# Patient Record
Sex: Female | Born: 1955
Health system: Southern US, Community
[De-identification: ages and names within clinical notes are randomized; demographics above are authoritative.]

## PROBLEM LIST (undated history)

## (undated) DIAGNOSIS — D219 Benign neoplasm of connective and other soft tissue, unspecified: Secondary | ICD-10-CM

## (undated) DIAGNOSIS — K900 Celiac disease: Secondary | ICD-10-CM

## (undated) DIAGNOSIS — A64 Unspecified sexually transmitted disease: Secondary | ICD-10-CM

## (undated) DIAGNOSIS — K9041 Non-celiac gluten sensitivity: Secondary | ICD-10-CM

## (undated) DIAGNOSIS — N946 Dysmenorrhea, unspecified: Secondary | ICD-10-CM

## (undated) DIAGNOSIS — D490 Neoplasm of unspecified behavior of digestive system: Secondary | ICD-10-CM

## (undated) DIAGNOSIS — F32A Depression, unspecified: Secondary | ICD-10-CM

## (undated) DIAGNOSIS — M810 Age-related osteoporosis without current pathological fracture: Secondary | ICD-10-CM

## (undated) DIAGNOSIS — N3090 Cystitis, unspecified without hematuria: Secondary | ICD-10-CM

## (undated) DIAGNOSIS — Z9189 Other specified personal risk factors, not elsewhere classified: Secondary | ICD-10-CM

## (undated) DIAGNOSIS — F329 Major depressive disorder, single episode, unspecified: Secondary | ICD-10-CM

## (undated) DIAGNOSIS — IMO0002 Reserved for concepts with insufficient information to code with codable children: Secondary | ICD-10-CM

## (undated) DIAGNOSIS — N920 Excessive and frequent menstruation with regular cycle: Secondary | ICD-10-CM

## (undated) HISTORY — DX: Benign neoplasm of connective and other soft tissue, unspecified: D21.9

## (undated) HISTORY — DX: Depression, unspecified: F32.A

## (undated) HISTORY — DX: Celiac disease: K90.0

## (undated) HISTORY — DX: Excessive and frequent menstruation with regular cycle: N92.0

## (undated) HISTORY — DX: Reserved for concepts with insufficient information to code with codable children: IMO0002

## (undated) HISTORY — DX: Major depressive disorder, single episode, unspecified: F32.9

## (undated) HISTORY — DX: Neoplasm of unspecified behavior of digestive system: D49.0

## (undated) HISTORY — DX: Age-related osteoporosis without current pathological fracture: M81.0

## (undated) HISTORY — DX: Unspecified sexually transmitted disease: A64

## (undated) HISTORY — DX: Other specified personal risk factors, not elsewhere classified: Z91.89

## (undated) HISTORY — DX: Dysmenorrhea, unspecified: N94.6

## (undated) HISTORY — DX: Cystitis, unspecified without hematuria: N30.90

## (undated) HISTORY — DX: Non-celiac gluten sensitivity: K90.41

---
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
## 1984-07-21 DIAGNOSIS — A64 Unspecified sexually transmitted disease: Secondary | ICD-10-CM

## 1984-07-21 HISTORY — DX: Unspecified sexually transmitted disease: A64

## 1991-06-21 DIAGNOSIS — D490 Neoplasm of unspecified behavior of digestive system: Secondary | ICD-10-CM

## 1991-06-21 HISTORY — DX: Neoplasm of unspecified behavior of digestive system: D49.0

## 1991-06-21 HISTORY — PX: SALIVARY GLAND SURGERY: SHX768

## 1994-11-19 DIAGNOSIS — N3090 Cystitis, unspecified without hematuria: Secondary | ICD-10-CM

## 1994-11-19 HISTORY — DX: Cystitis, unspecified without hematuria: N30.90

## 1996-01-14 DIAGNOSIS — R87619 Unspecified abnormal cytological findings in specimens from cervix uteri: Secondary | ICD-10-CM

## 1996-01-14 DIAGNOSIS — IMO0002 Reserved for concepts with insufficient information to code with codable children: Secondary | ICD-10-CM

## 1996-01-14 HISTORY — DX: Unspecified abnormal cytological findings in specimens from cervix uteri: R87.619

## 1996-01-14 HISTORY — DX: Reserved for concepts with insufficient information to code with codable children: IMO0002

## 1998-02-26 ENCOUNTER — Other Ambulatory Visit: Admission: RE | Admit: 1998-02-26 | Discharge: 1998-02-26 | Payer: Self-pay | Admitting: Obstetrics and Gynecology

## 1998-09-20 ENCOUNTER — Other Ambulatory Visit: Admission: RE | Admit: 1998-09-20 | Discharge: 1998-09-20 | Payer: Self-pay | Admitting: Obstetrics and Gynecology

## 1999-03-01 ENCOUNTER — Other Ambulatory Visit: Admission: RE | Admit: 1999-03-01 | Discharge: 1999-03-01 | Payer: Self-pay | Admitting: Obstetrics and Gynecology

## 1999-09-18 ENCOUNTER — Other Ambulatory Visit: Admission: RE | Admit: 1999-09-18 | Discharge: 1999-09-18 | Payer: Self-pay | Admitting: Obstetrics and Gynecology

## 2000-03-31 ENCOUNTER — Other Ambulatory Visit: Admission: RE | Admit: 2000-03-31 | Discharge: 2000-03-31 | Payer: Self-pay | Admitting: Obstetrics and Gynecology

## 2000-09-29 ENCOUNTER — Other Ambulatory Visit: Admission: RE | Admit: 2000-09-29 | Discharge: 2000-09-29 | Payer: Self-pay | Admitting: Obstetrics and Gynecology

## 2001-04-13 ENCOUNTER — Other Ambulatory Visit: Admission: RE | Admit: 2001-04-13 | Discharge: 2001-04-13 | Payer: Self-pay | Admitting: Obstetrics and Gynecology

## 2001-10-05 ENCOUNTER — Other Ambulatory Visit: Admission: RE | Admit: 2001-10-05 | Discharge: 2001-10-05 | Payer: Self-pay | Admitting: *Deleted

## 2002-04-14 ENCOUNTER — Other Ambulatory Visit: Admission: RE | Admit: 2002-04-14 | Discharge: 2002-04-14 | Payer: Self-pay | Admitting: Obstetrics and Gynecology

## 2002-07-21 HISTORY — PX: DILATION AND CURETTAGE OF UTERUS: SHX78

## 2002-08-05 ENCOUNTER — Ambulatory Visit (HOSPITAL_COMMUNITY): Admission: RE | Admit: 2002-08-05 | Discharge: 2002-08-05 | Payer: Self-pay | Admitting: Obstetrics and Gynecology

## 2002-09-19 HISTORY — PX: TOTAL ABDOMINAL HYSTERECTOMY: SHX209

## 2002-09-27 ENCOUNTER — Inpatient Hospital Stay (HOSPITAL_COMMUNITY): Admission: RE | Admit: 2002-09-27 | Discharge: 2002-09-29 | Payer: Self-pay | Admitting: Obstetrics and Gynecology

## 2005-07-22 ENCOUNTER — Encounter: Admission: RE | Admit: 2005-07-22 | Discharge: 2005-10-20 | Payer: Self-pay | Admitting: Gastroenterology

## 2006-10-16 ENCOUNTER — Other Ambulatory Visit: Admission: RE | Admit: 2006-10-16 | Discharge: 2006-10-16 | Payer: Self-pay | Admitting: Obstetrics & Gynecology

## 2013-04-14 ENCOUNTER — Encounter: Payer: Self-pay | Admitting: Obstetrics & Gynecology

## 2013-05-03 ENCOUNTER — Encounter: Payer: Self-pay | Admitting: Obstetrics & Gynecology

## 2013-05-03 ENCOUNTER — Ambulatory Visit (INDEPENDENT_AMBULATORY_CARE_PROVIDER_SITE_OTHER): Payer: 59 | Admitting: Obstetrics & Gynecology

## 2013-05-03 VITALS — BP 118/62 | HR 60 | Resp 16 | Ht 67.25 in | Wt 137.4 lb

## 2013-05-03 DIAGNOSIS — M810 Age-related osteoporosis without current pathological fracture: Secondary | ICD-10-CM

## 2013-05-03 NOTE — Progress Notes (Signed)
Subjective:    The patient is a 27 yrs MarriedCaucasian G0P0  female with LMP 07/21/2002 here to discuss most recent BMD done 03/09/13.  In reviewing results with pt, I realized that the right hip, the one with the ostoeporosis, has never been imaged in the past.  This is the one showing osteoporosis.  Overall, the BMD does look stable when comparisons from the left and spine are made.  We discussed this as well as options for treatment as below.  She does not want to do anything until she know si this is really a new finding or whether the finding is stable.    Osteoporosis Risk Factors  Nonmodifiable Personal Hx of fracture as an adult: no Family hx of osteoporosis:  Yes, mother Hx of fracture in first-degree relative: yes - mother, neck Caucasian race: yes Advanced age: no Female sex: yes Dementia: no Poor health/frailty: no  Potentially modifiable: Tobacco use: no Low body weight (<127 lbs): no Estrogen deficiency  early menopause (age <45) or bilateral ovariectomy: no  prolonged premenopausal amenorrhea (>1 yr): no Low calcium intake (lifelong): no Alcohol use more than 2 drinks per day: no Recurrent falls: no Inadequate physical activity: no  Current calcium and Vit D intake:  Calcium 500mg /day, MVI with Vit D  Review of Systems A comprehensive review of systems was negative.     Objective:   PHYSICAL EXAM BP 118/62  Pulse 60  Resp 16  Ht 5' 7.25" (1.708 m)  Wt 137 lb 6.4 oz (62.324 kg)  BMI 21.36 kg/m2  LMP 07/21/2002  General appearance: alert and cooperative  Imaging Bone Density: Spine T Score: -2.0, Hip T Score: -2.7   Done on 03/09/13 FRAX score:  10 year probability of hip fracture: 2.6%.                        10-year probability of major osteoporotic fractures combined is 10.6%.   Assessment:   Osteoporosis   Plan:     1.  Will contact [facility].  If have never imaged right side, will repeat BMD 2 years.  If have and there is change, consider  treatment options.    2.  Pharmacologic therapy therapy below discussed including risks and benefits.   Bisphosphonates po (Fosamax, Actonel, Boniva)  Bisphosphonate IV (Reclast)  Evista  Prolia subcutaneous   Forteo subcutaneous  Calcitonin nasal spray  Estrogen/progesterone therapy

## 2013-05-03 NOTE — Patient Instructions (Signed)
I will call and let you know about the BMD results from the past--if right hip was imaged in the past.

## 2013-08-17 ENCOUNTER — Ambulatory Visit: Payer: Self-pay | Admitting: Obstetrics & Gynecology

## 2013-08-19 ENCOUNTER — Ambulatory Visit: Payer: Self-pay | Admitting: Obstetrics & Gynecology

## 2013-08-22 ENCOUNTER — Encounter: Payer: Self-pay | Admitting: Obstetrics & Gynecology

## 2013-08-22 ENCOUNTER — Ambulatory Visit (INDEPENDENT_AMBULATORY_CARE_PROVIDER_SITE_OTHER): Payer: 59 | Admitting: Obstetrics & Gynecology

## 2013-08-22 VITALS — BP 120/62 | HR 64 | Resp 16 | Ht 67.5 in | Wt 137.6 lb

## 2013-08-22 DIAGNOSIS — Z01419 Encounter for gynecological examination (general) (routine) without abnormal findings: Secondary | ICD-10-CM

## 2013-08-22 DIAGNOSIS — Z124 Encounter for screening for malignant neoplasm of cervix: Secondary | ICD-10-CM

## 2013-08-22 NOTE — Patient Instructions (Signed)

## 2013-08-22 NOTE — Progress Notes (Addendum)
58 y.o. G0P0 [name] here for annual exam.  No vaginal bleeding.  Reports she has lots of animals/cats.  Has labs with Dr. Drema Dallas.  BMD 10/14 with osteoporosis.  Pt will have repeat BMD 2 years.  Will ask specifically for both hips to be imaged as right femur is one with osteoporosis (t score -2.7) and left has moderate osteopenia (t score -1.9).  Right has no prior comparisons.  Left was decreased 7% from 2011 to 2014.   Patient's last menstrual period was 07/21/2002.          Sexually active: no  The current method of family planning is status post hysterectomy.    Exercising: yes  walk 2-3 times/wk Smoker:  no  Health Maintenance: Pap:  05/19/12 Neg History of abnormal Pap:  Yes, H/O ASCUS MMG:  03/09/13 Bi-Rads 2, grade 4 dense breast Colonoscopy:  06/2005- Celiac Disease f/u in 10 years, Dr. Collene Mares BMD:   03/09/13, -2.7 hip TDaP:  2008 Screening Labs: Dr. Ambrose Pancoast, MD   reports that she has never smoked. She has never used smokeless tobacco. She reports that she does not drink alcohol or use illicit drugs.  Past Medical History  Diagnosis Date  . Depression   . PMS (premenstrual syndrome)   . 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 DES exposure in utero, unknown     Bx proven adenosis  . Dysmenorrhea   . Menorrhagia   . Fibroid     polyps  . Osteopenia   . Gluten intolerance   . Abnormal Pap smear 01/14/96    ASC-US  . Cystitis 5/96  . STD (sexually transmitted disease) 1986    + chlamydia  . Celiac disease     Past Surgical History  Procedure Laterality Date  . Salivary gland tumor exc  06/1991    benign  . Dilation and curettage of uterus  1974    Irregular vaginal bleeding  . Dilation and curettage of uterus  07/2002    Hysterscopy  . Total abdominal hysterectomy  3/04  . Colposcopy  8/97    ECC /NEG    Current Outpatient Prescriptions  Medication Sig Dispense Refill  . CALCIUM PO Take 500 mg by mouth daily.      . cetirizine (ZYRTEC ALLERGY) 10  MG tablet Take 10 mg by mouth daily.      . Cholecalciferol (VITAMIN D PO) Take by mouth.      . clidinium-chlordiazePOXIDE (LIBRAX) 2.5-5 MG per capsule       . Multiple Vitamin (MULTI VITAMIN DAILY PO) Take by mouth daily.      . Omeprazole (PRILOSEC PO) Take by mouth.      . pravastatin (PRAVACHOL) 20 MG tablet        No current facility-administered medications for this visit.    Family History  Problem Relation Age of Onset  . Osteoporosis Mother   . Dementia Mother   . Heart disease Maternal Grandfather     ROS:  Pertinent items are noted in HPI.  Otherwise, a comprehensive ROS was negative.  Exam:   BP 120/62  Pulse 64  Resp 16  Ht 5' 7.5" (1.715 m)  Wt 137 lb 9.6 oz (62.415 kg)  BMI 21.22 kg/m2  LMP 07/21/2002  Weight change: +1lb   Height: 5' 7.5" (171.5 cm)  Ht Readings from Last 3 Encounters:  08/22/13 5' 7.5" (1.715 m)  05/03/13 5' 7.25" (1.708 m)    General appearance: alert, cooperative and appears stated age Head:  Normocephalic, without obvious abnormality, atraumatic Neck: no adenopathy, supple, symmetrical, trachea midline and thyroid normal to inspection and palpation Lungs: clear to auscultation bilaterally Breasts: normal appearance, no masses or tenderness Heart: regular rate and rhythm Abdomen: soft, non-tender; bowel sounds normal; no masses,  no organomegaly Extremities: extremities normal, atraumatic, no cyanosis or edema Skin: Skin color, texture, turgor normal. No rashes or lesions Lymph nodes: Cervical, supraclavicular, and axillary nodes normal. No abnormal inguinal nodes palpated Neurologic: Grossly normal   Pelvic: External genitalia:  no lesions              Urethra:  normal appearing urethra with no masses, tenderness or lesions              Bartholins and Skenes: normal                 Vagina: normal appearing vagina with normal color and discharge, no lesions              Cervix: absent              Pap taken: yes Bimanual Exam:   Uterus:  uterus absent              Adnexa: normal adnexa and no mass, fullness, tenderness               Rectovaginal: Confirms               Anus:  normal sphincter tone, no lesions  A:  Well Woman with normal exam H/O TAH H/O DES exposure Celiac disease Osteoporosis in right hip only with no prior comparisons.  See notation above.  Scan is in EPIC from [place].  P:   Mammogram yearly.  D/W pt doing 3D MMG due to grade 4 breasts densities. pap smear obtained. Labs with Dr. Drema Dallas.  Pt will have labs faxed to me. BMD in two years.  Pt really does not want to be on medication if possible.  Working on good calcium intake and exercise. return annually or prn  An After Visit Summary was printed and given to the patient.

## 2013-08-24 LAB — IPS PAP SMEAR ONLY

## 2014-05-22 ENCOUNTER — Encounter: Payer: Self-pay | Admitting: Obstetrics & Gynecology

## 2014-08-29 ENCOUNTER — Ambulatory Visit: Payer: 59 | Admitting: Obstetrics & Gynecology

## 2014-09-25 ENCOUNTER — Ambulatory Visit (INDEPENDENT_AMBULATORY_CARE_PROVIDER_SITE_OTHER): Payer: 59 | Admitting: Obstetrics & Gynecology

## 2014-09-25 ENCOUNTER — Encounter: Payer: Self-pay | Admitting: Obstetrics & Gynecology

## 2014-09-25 VITALS — BP 118/64 | HR 60 | Resp 16 | Ht 67.25 in | Wt 131.8 lb

## 2014-09-25 DIAGNOSIS — Z01419 Encounter for gynecological examination (general) (routine) without abnormal findings: Secondary | ICD-10-CM

## 2014-09-25 DIAGNOSIS — Z124 Encounter for screening for malignant neoplasm of cervix: Secondary | ICD-10-CM | POA: Diagnosis not present

## 2014-09-25 NOTE — Progress Notes (Signed)
59 y.o. G0P0 [name] here for annual exam.  Doing well.  No vaginal bleeding.  Needs BMD in August.  Will place order for pt.  Reports Cheryl Reynolds isn't exercising like Cheryl Reynolds knows Cheryl Reynolds needs to but the weather has something to do with this.  PCP:  Dr. Drema Dallas.  Last seen 08/24/13.  Pt states Cheryl Reynolds will call for appt so will not do labs today.    Patient's last menstrual period was 07/21/2002.          Sexually active: Yes.    The current method of family planning is status post hysterectomy.    Exercising: Yes.    some walking and horse back riding Smoker:  no  Health Maintenance: Pap:  08/22/13 WNL History of abnormal Pap:  Yes h/o ASCUS MMG:  03/13/14 3D-normal Colonoscopy:  12/06-repeat in 10 years.  Aware due this year. BMD:   03/09/13 TDaP:  2008 Screening Labs: PCP, Hb today: PCP, Urine today: RBC-trace   reports that Cheryl Reynolds has never smoked. Cheryl Reynolds has never used smokeless tobacco. Cheryl Reynolds reports that Cheryl Reynolds drinks alcohol. Cheryl Reynolds reports that Cheryl Reynolds does not use illicit drugs.  Past Medical History  Diagnosis Date  . Depression   . PMS (premenstrual syndrome)   . 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 DES exposure in utero, unknown     Bx proven adenosis  . Dysmenorrhea   . Menorrhagia   . Fibroid     polyps  . Osteopenia   . Gluten intolerance   . Abnormal Pap smear 01/14/96    ASC-US  . Cystitis 5/96  . STD (sexually transmitted disease) 1986    + chlamydia  . Celiac disease     Past Surgical History  Procedure Laterality Date  . Salivary gland tumor exc  06/1991    benign  . Dilation and curettage of uterus  1974    Irregular vaginal bleeding  . Dilation and curettage of uterus  07/2002    Hysterscopy  . Total abdominal hysterectomy  3/04    Current Outpatient Prescriptions  Medication Sig Dispense Refill  . CALCIUM PO Take 500 mg by mouth daily.    . cetirizine (ZYRTEC ALLERGY) 10 MG tablet Take 10 mg by mouth daily.    . Cholecalciferol (VITAMIN D PO) Take by mouth.    .  clidinium-chlordiazePOXIDE (LIBRAX) 2.5-5 MG per capsule     . Multiple Vitamin (MULTI VITAMIN DAILY PO) Take by mouth daily.    . Omeprazole (PRILOSEC PO) Take by mouth.    . pravastatin (PRAVACHOL) 20 MG tablet      No current facility-administered medications for this visit.    Family History  Problem Relation Age of Onset  . Osteoporosis Mother   . Dementia Mother   . Heart disease Maternal Grandfather     ROS:  Pertinent items are noted in HPI.  Otherwise, a comprehensive ROS was negative.  Exam:   BP 118/64 mmHg  Pulse 60  Resp 16  Ht 5' 7.25" (1.708 m)  Wt 131 lb 12.8 oz (59.784 kg)  BMI 20.49 kg/m2  LMP 07/21/2002  Weight change: -6#   Height: 5' 7.25" (170.8 cm)  Ht Readings from Last 3 Encounters:  09/25/14 5' 7.25" (1.708 m)  08/22/13 5' 7.5" (1.715 m)  05/03/13 5' 7.25" (1.708 m)    General appearance: alert, cooperative and appears stated age Head: Normocephalic, without obvious abnormality, atraumatic Neck: no adenopathy, supple, symmetrical, trachea midline and thyroid normal to inspection and palpation Lungs: clear to  auscultation bilaterally Breasts: normal appearance, no masses or tenderness Heart: regular rate and rhythm Abdomen: soft, non-tender; bowel sounds normal; no masses,  no organomegaly Extremities: extremities normal, atraumatic, no cyanosis or edema Skin: Skin color, texture, turgor normal. No rashes or lesions Lymph nodes: Cervical, supraclavicular, and axillary nodes normal. No abnormal inguinal nodes palpated Neurologic: Grossly normal   Pelvic: External genitalia:  no lesions              Urethra:  normal appearing urethra with no masses, tenderness or lesions              Bartholins and Skenes: normal                 Vagina: normal appearing vagina with normal color and discharge, no lesions              Cervix: absent              Pap taken: Yes.   Bimanual Exam:  Uterus:  uterus absent              Adnexa: no mass, fullness,  tenderness               Rectovaginal: Confirms               Anus:  normal sphincter tone, no lesions  Chaperone was present for exam.  A:  Well Woman with normal exam H/O TAH H/O DES exposure Celiac disease Osteoporosis in right hip only with no prior comparisons.  Plan repeat this year.  Orders will be sent so pt can schedule in August.   P: Mammogram yearly. D/W pt doing 3D MMG due to grade 4 breasts densities pap smear obtained.  Done yearly due to DES hx. Pt will call Dr. Drema Dallas for AEX appt. BMD in August. return annually or prn

## 2014-09-28 LAB — IPS PAP TEST WITH REFLEX TO HPV

## 2015-04-10 ENCOUNTER — Telehealth: Payer: Self-pay

## 2015-04-10 NOTE — Telephone Encounter (Signed)
Patient notified of BMD results. Consult appointment scheduled for 04/13/15 with Dr Miller.//kn

## 2015-04-13 ENCOUNTER — Encounter: Payer: Self-pay | Admitting: Obstetrics & Gynecology

## 2015-04-13 ENCOUNTER — Ambulatory Visit (INDEPENDENT_AMBULATORY_CARE_PROVIDER_SITE_OTHER): Payer: 59 | Admitting: Obstetrics & Gynecology

## 2015-04-13 VITALS — BP 108/62 | HR 68 | Resp 16 | Ht 67.25 in | Wt 135.0 lb

## 2015-04-13 DIAGNOSIS — M810 Age-related osteoporosis without current pathological fracture: Secondary | ICD-10-CM | POA: Diagnosis not present

## 2015-04-13 LAB — TSH: TSH: 0.869 u[IU]/mL (ref 0.350–4.500)

## 2015-04-13 MED ORDER — RALOXIFENE HCL 60 MG PO TABS: 60.0000 mg | ORAL_TABLET | Freq: Every day | ORAL | Status: DC

## 2015-04-13 NOTE — Progress Notes (Signed)
Subjective:    29 yrs Married Caucasian G0P0  female here to discuss recent BMD obtained 04/02/15 showing osteoporosis in her right femoral neck with T score -2.7.  Pt's last two BMD have shown worsening t scores.  2011 was -2.2 and 2014 was -2.7.  Reviewed these with pt together today.    Treatment options discussed with pt today.  Pt does have hx of GERD and has been on prilosec long term.   Pharmacologic therapy discussed, including risks and benefits, were Bisphosphonates po and IV, Evista, Prolia, calcitonin, and HRT.       Osteoporosis Risk Factors  Nonmodifiable Personal Hx of fracture as an adult: no Hx of fracture in first-degree relative: no Family hx of osteoporosis:  yes  Potentially modifiable: Tobacco use: no Low body weight (<127 lbs): no Estrogen deficiency  early menopause (age <45) or bilateral ovariectomy: no  prolonged premenopausal amenorrhea (>1 yr): no Low calcium intake (lifelong): no Alcohol use more than 2 drinks per day: no Recurrent falls: no Inadequate physical activity: no  Current calcium and Vit D intake:  500mg  calcium daily and 800 IU Vit D  Review of Systems A comprehensive review of systems was negative.     Objective:   PHYSICAL EXAM BP 108/62 mmHg  Pulse 68  Resp 16  Ht 5' 7.25" (1.708 m)  Wt 135 lb (61.236 kg)  BMI 20.99 kg/m2  LMP 07/21/2002 General appearance: alert, cooperative and appears stated age  Imaging Bone Density: Spine T Score: -2.7, Hip T Score: -0.6   Done on 04/02/15 FRAX score:  10 year probability of hip fracture: 3.3%                        10-year probability of major osteoporotic fractures combined is 12.6%                                         Assessment:   Osteoporosis with T score -2.7 and FRAX score 3.3% for hip fracture   Plan:   1. Encouraged continued calcium and Vit D supplementation 2.  Exercise recommended at least 30 minutes 3 times per week.  3.  PTH with calcium level and TSH today.  Pt  had normal Vit D with PCP (per her report) earlier this year. 4.  Will start Evista 60mg  daily.  Rx to pharmacy.   5.  Repeat bone density in 2 years.  ~15 minutes spent with patient >50% of time was in face to face discussion of above.

## 2015-04-16 LAB — PTH, INTACT AND CALCIUM
Calcium: 9.3 mg/dL (ref 8.4–10.5)
PTH: 37 pg/mL (ref 14–64)

## 2015-04-22 ENCOUNTER — Encounter: Payer: Self-pay | Admitting: Obstetrics & Gynecology

## 2015-04-23 ENCOUNTER — Telehealth: Payer: Self-pay

## 2015-04-23 NOTE — Telephone Encounter (Signed)
Telephone encounter created regarding mychart message for Dr.Jertson's review.

## 2015-04-23 NOTE — Telephone Encounter (Signed)
Please inform the patient that a small percentage of patients can have bowel symptoms with the Evista. There is nothing in the product description that discusses the effects with celiac disease. Taking a few days off may help her sort out her symptoms. If she doesn't restart it, she will need to discuss other options with Dr Sabra Heck.

## 2015-04-23 NOTE — Telephone Encounter (Signed)
Non-Urgent Medical Question  Message [id number]   From  ANISTEN TOMASSI   To  Megan Salon, MD   Sent  04/22/2015 8:23 PM     Hi Dr Sabra Heck, I have been taking the medicine Raloxifene HCL 60mg  (Common Name-Evista) every day since last Sunday, September 25th. I have been taking it in the morning at breakfast with food. Every day within 5 to 8 hours, I will have stomach pain and will have to find a restroom. Yesterday, I had stomach bloating no diarrhea. It is usually after I eat in the evenings. Can this medicine interfere with my Celiac Disease? I am going off this medicine on Monday (October 3rd) and see if my symptoms improve. Thank you, Satira Sark      Responsible Party    Pool - [facility] Pool No one has taken responsibility for this message.     No actions have been taken on this message.

## 2015-04-23 NOTE — Telephone Encounter (Signed)
Mychart message as seen below sent to [place] for review and advise.

## 2015-04-24 NOTE — Telephone Encounter (Signed)
Spoke with patient. Please see telephone encounter dated 04/23/2015.  Routing to provider for final review. Patient agreeable to disposition. Will close encounter.

## 2015-04-24 NOTE — Telephone Encounter (Signed)
Spoke with patient. Advised of message as seen below from [place]. Patient is agreeable. Patient stopped taking Evista yesterday. "I feel better, but I want to give it a couple more days to see." Will call our office to give [place] an update in a couple of days. Aware if she does not restart the medication she will need to discuss other options with Dr.Miller. Patient is agreeable.  Routing to provider for final review. Patient agreeable to disposition. Will close encounter.

## 2015-05-08 ENCOUNTER — Encounter: Payer: Self-pay | Admitting: Obstetrics & Gynecology

## 2015-05-09 ENCOUNTER — Telehealth: Payer: Self-pay | Admitting: Emergency Medicine

## 2015-05-09 NOTE — Telephone Encounter (Signed)
She should stop the Evista.  I am fine if she tries the Algea Cal, increased weight bearing exercise 31min 5 times weekly.  Goal for calcium intake is 1200-1500mg  total.  I will repeat the BMD in two years.  If worse, we will readdress medications.

## 2015-05-09 NOTE — Telephone Encounter (Signed)
Chief Complaint  Patient presents with  . Advice Only    Patient sent mychart message    -- Message -----    From: [facility]    Sent: 05/08/2015  2:02 PM EDT      To: Lyman Speller, MD Subject: Non-Urgent Medical Question  Hello Dr Sabra Heck, I did try the Raloxifene (Evista) a second time by trying to take it in the evenings.  I still cannot take it without stomach issues.I have read some articles on osteoporosis and the side effects of the medications can be severe.  I have read some reviews that Algea Cal has reversed the Dex numbers.  Could I try Algea Cal, increased exercise program, and a diet (food) with more calcium?  I realize this is not an approved medical protocol. I am willing to come in to the office to discuss, if you need me to. Thank you, Syesha Thaw [phone] or [phone]

## 2015-05-09 NOTE — Telephone Encounter (Signed)
Message left to return call to Cheryl Reynolds at [phone].    

## 2015-05-09 NOTE — Telephone Encounter (Signed)
Dr. Sabra Heck, can you review and advise?

## 2015-05-09 NOTE — Telephone Encounter (Signed)
Telephone call for triage created to discuss message with patient and disposition as appropriate.   

## 2015-05-10 NOTE — Telephone Encounter (Signed)
Patient returned call. She is given message from Dr. Sabra Heck and will try recommendations and follow up with per Dr. Sabra Heck.  She will stop Evista. Patient advised to call back with any concerns. Patient agreeable to plan.  Routing to provider for final review. Patient agreeable to disposition. Will close encounter.

## 2015-12-07 ENCOUNTER — Encounter: Payer: Self-pay | Admitting: Obstetrics & Gynecology

## 2015-12-07 ENCOUNTER — Ambulatory Visit (INDEPENDENT_AMBULATORY_CARE_PROVIDER_SITE_OTHER): Payer: 59 | Admitting: Obstetrics & Gynecology

## 2015-12-07 VITALS — BP 106/58 | HR 82 | Resp 14 | Ht 67.25 in | Wt 135.6 lb

## 2015-12-07 DIAGNOSIS — Z01419 Encounter for gynecological examination (general) (routine) without abnormal findings: Secondary | ICD-10-CM

## 2015-12-07 DIAGNOSIS — Z124 Encounter for screening for malignant neoplasm of cervix: Secondary | ICD-10-CM | POA: Diagnosis not present

## 2015-12-07 DIAGNOSIS — Z205 Contact with and (suspected) exposure to viral hepatitis: Secondary | ICD-10-CM

## 2015-12-07 NOTE — Progress Notes (Signed)
60 y.o. G0P0 Married [name] here for annual exam.  She is exercising and doing weight bearing exercise trying to avoid medication.  Taking calcium and Vit D as well.    Denies vaginal bleeding.    PCP:  Dr. Drema Dallas.  Will have blood work in August.  Patient's last menstrual period was 07/21/2002.          Sexually active: yes, but not currently  The current method of family planning is post menopausal status.    Exercising: Yes.    walking, gym exercises Smoker:  no  Health Maintenance: Pap:  09/25/2014 negative History of abnormal Pap:  yes MMG:  04/02/2015 BIRADS 1 negative  Colonoscopy:  07/02/2015  BMD:   04/02/2015 osteoporosis TDaP:  2008 Screening Labs: PCP, Hb today: PCP, Urine today: PCP   reports that she has never smoked. She has never used smokeless tobacco. She reports that she drinks alcohol. She reports that she does not use illicit drugs.  Past Medical History  Diagnosis Date  . Depression   . PMS (premenstrual syndrome)   . 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 DES exposure in utero, unknown     Bx proven adenosis  . Dysmenorrhea   . Menorrhagia   . Fibroid     polyps  . Osteopenia   . Gluten intolerance   . Abnormal Pap smear 01/14/96    ASC-US  . Cystitis 5/96  . STD (sexually transmitted disease) 1986    + chlamydia  . Celiac disease   . Osteoporosis     Past Surgical History  Procedure Laterality Date  . Salivary gland tumor exc  06/1991    benign  . Dilation and curettage of uterus  1974    Irregular vaginal bleeding  . Dilation and curettage of uterus  07/2002    Hysterscopy  . Total abdominal hysterectomy  3/04    Current Outpatient Prescriptions  Medication Sig Dispense Refill  . CALCIUM PO Take 500 mg by mouth daily.    . cetirizine (ZYRTEC ALLERGY) 10 MG tablet Take 10 mg by mouth daily.    . Cholecalciferol (VITAMIN D PO) Take by mouth.    . clidinium-chlordiazePOXIDE (LIBRAX) 2.5-5 MG per capsule     . Multiple Vitamin (MULTI  VITAMIN DAILY PO) Take by mouth daily.    . Omeprazole (PRILOSEC PO) Take by mouth.    . pravastatin (PRAVACHOL) 20 MG tablet      No current facility-administered medications for this visit.    Family History  Problem Relation Age of Onset  . Osteoporosis Mother   . Dementia Mother   . Heart disease Maternal Grandfather     ROS:  Pertinent items are noted in HPI.  Otherwise, a comprehensive ROS was negative.  Exam:   Filed Vitals:   12/07/15 1451  BP: 106/58  Pulse: 82  Resp: 14   General appearance: alert, cooperative and appears stated age Head: Normocephalic, without obvious abnormality, atraumatic Neck: no adenopathy, supple, symmetrical, trachea midline and thyroid normal to inspection and palpation Lungs: clear to auscultation bilaterally Breasts: normal appearance, no masses or tenderness Heart: regular rate and rhythm Abdomen: soft, non-tender; bowel sounds normal; no masses,  no organomegaly Extremities: extremities normal, atraumatic, no cyanosis or edema Skin: Skin color, texture, turgor normal. No rashes or lesions Lymph nodes: Cervical, supraclavicular, and axillary nodes normal. No abnormal inguinal nodes palpated Neurologic: Grossly normal   Pelvic: External genitalia:  no lesions  Urethra:  normal appearing urethra with no masses, tenderness or lesions              Bartholins and Skenes: normal                 Vagina: normal appearing vagina with normal color and discharge, no lesions              Cervix: absent              Pap taken: Yes Bimanual Exam:  Uterus:  uterus absent              Adnexa: no mass, fullness, tenderness               Rectovaginal: Confirms               Anus:  normal sphincter tone, no lesions  Chaperone was present for exam.  A:  Well Woman with normal exam H/O TAH H/O DES exposure Celiac disease Osteoporosis in right hip only with no prior comparisons. Plan repeat this year. Orders will be sent so pt can  schedule in August.   P: Mammogram yearly. D/W pt doing 3D MMG due to grade 4 breasts densities pap smear obtained. Done yearly due to DES hx. Pt will call Dr. Drema Dallas for AEX appt. BMD in August. return annually or prn

## 2015-12-08 LAB — HEPATITIS C ANTIBODY: HCV Ab: NEGATIVE

## 2015-12-11 LAB — IPS PAP SMEAR ONLY

## 2015-12-13 ENCOUNTER — Telehealth: Payer: Self-pay | Admitting: Obstetrics & Gynecology

## 2015-12-13 NOTE — Telephone Encounter (Signed)
Patient returned Emily's call and scheduled for 12/20/15 for a repeat pap due to "insufficient cells."

## 2015-12-13 NOTE — Telephone Encounter (Signed)
Noted patient's appointment on 12/20/15

## 2015-12-20 ENCOUNTER — Encounter: Payer: Self-pay | Admitting: Obstetrics & Gynecology

## 2015-12-20 ENCOUNTER — Ambulatory Visit (INDEPENDENT_AMBULATORY_CARE_PROVIDER_SITE_OTHER): Payer: 59 | Admitting: Obstetrics & Gynecology

## 2015-12-20 VITALS — BP 120/56 | HR 76 | Resp 18 | Ht 67.0 in | Wt 135.4 lb

## 2015-12-20 DIAGNOSIS — Z124 Encounter for screening for malignant neoplasm of cervix: Secondary | ICD-10-CM

## 2015-12-20 DIAGNOSIS — R87615 Unsatisfactory cytologic smear of cervix: Secondary | ICD-10-CM

## 2015-12-20 DIAGNOSIS — Z9189 Other specified personal risk factors, not elsewhere classified: Secondary | ICD-10-CM | POA: Diagnosis not present

## 2015-12-20 NOTE — Progress Notes (Signed)
60 y.o. G0P0 Married [name] here for repeat Pap smear due to history of insufficient cells on pap smear.  Pt denies vaginal bleeding.  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    H/O TAH 09/2002.  EXAM: BP 120/56 mmHg  Pulse 76  Resp 18  Ht 5\' 7"  (1.702 m)  Wt 135 lb 6.4 oz (61.417 kg)  BMI 21.20 kg/m2  LMP 07/21/2002 General appearance:  WNWD Female, NAD Abd:  Soft, NT, ND, no masses, hernias, or organomegaly Pelvic exam:  VULVA: normal appearing vulva with no masses, tenderness or lesions VAGINA: normal appearing vagina with normal color and discharge, no lesions CERVIX: normal appearing cervix without discharge or lesions, surgically absent UTERUS: surgically absent, vaginal cuff well healed ADNEXA: no masses  PAP: Pap smear done today   Assessment: History of insufficient cells on pap  Plan:  Pap obtained today.  Will call pt with results.

## 2015-12-21 LAB — IPS PAP SMEAR ONLY

## 2016-06-18 HISTORY — PX: MENISCUS REPAIR: SHX5179

## 2016-07-29 DIAGNOSIS — K219 Gastro-esophageal reflux disease without esophagitis: Secondary | ICD-10-CM | POA: Diagnosis not present

## 2016-07-29 DIAGNOSIS — K900 Celiac disease: Secondary | ICD-10-CM | POA: Diagnosis not present

## 2016-07-29 DIAGNOSIS — K580 Irritable bowel syndrome with diarrhea: Secondary | ICD-10-CM | POA: Diagnosis not present

## 2016-12-09 ENCOUNTER — Encounter: Payer: Self-pay | Admitting: Obstetrics & Gynecology

## 2016-12-09 ENCOUNTER — Ambulatory Visit (INDEPENDENT_AMBULATORY_CARE_PROVIDER_SITE_OTHER): Payer: 59 | Admitting: Obstetrics & Gynecology

## 2016-12-09 ENCOUNTER — Other Ambulatory Visit (HOSPITAL_COMMUNITY)
Admission: RE | Admit: 2016-12-09 | Discharge: 2016-12-09 | Disposition: A | Discharge status: Home / Self Care | Payer: 59 | Source: Ambulatory Visit | Attending: Obstetrics & Gynecology | Admitting: Obstetrics & Gynecology

## 2016-12-09 VITALS — BP 108/60 | HR 80 | Resp 16 | Ht 67.25 in | Wt 132.0 lb

## 2016-12-09 DIAGNOSIS — Z9189 Other specified personal risk factors, not elsewhere classified: Secondary | ICD-10-CM | POA: Diagnosis not present

## 2016-12-09 DIAGNOSIS — Z124 Encounter for screening for malignant neoplasm of cervix: Secondary | ICD-10-CM | POA: Diagnosis not present

## 2016-12-09 DIAGNOSIS — Z01419 Encounter for gynecological examination (general) (routine) without abnormal findings: Secondary | ICD-10-CM | POA: Diagnosis not present

## 2016-12-09 NOTE — Progress Notes (Signed)
61 y.o. G0P0 [name] here for annual exam.  Had left knee surgery in November.  This has helped her pain.  Still has arthritis in knees but dealing with this well.  PCP:  Dr. Drema Dallas.  Reports cholesterol was under 200.  Patient's last menstrual period was 07/21/2002.          Sexually active: No.  The current method of family planning is post menopausal status.    Exercising: Yes.    walk 1.5-2 miles 5-7 days per week Smoker:  no  Health Maintenance: Pap:  12/20/15 Neg    09/25/14 Neg  History of abnormal Pap:  yes MMG:  04/14/16 BIRADS1:Neg  Colonoscopy:  07/02/2015 , negative BMD:   04/02/15 Osteoporosis  TDaP:  06/2016 w/ PCP Pneumonia vaccine(s):  never Zostavax:   Plans to get this in June Hep C testing: 12/07/15 Neg  Screening Labs: PCP takes care of lab   reports that she has never smoked. She has never used smokeless tobacco. She reports that she drinks alcohol. She reports that she does not use drugs.  Past Medical History:  Diagnosis Date  . Abnormal Pap smear 01/14/96   ASC-US  . Celiac disease   . Cystitis 5/96  . Depression   . DES exposure in utero, unknown    Bx proven adenosis  . Dysmenorrhea   . Fibroid    polyps  . Gluten intolerance   . Menorrhagia   . Osteopenia   . Osteoporosis   . PMS (premenstrual syndrome)   . 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 STD (sexually transmitted disease) 1986   + chlamydia    Past Surgical History:  Procedure Laterality Date  . DILATION AND CURETTAGE OF UTERUS  1974   Irregular vaginal bleeding  . DILATION AND CURETTAGE OF UTERUS  07/2002   Hysterscopy  . KNEE SURGERY Left 06/18/2016  . salivary gland tumor exc  06/1991   benign  . TOTAL ABDOMINAL HYSTERECTOMY  3/04    Current Outpatient Prescriptions  Medication Sig Dispense Refill  . CALCIUM PO Take 800 mg by mouth daily.     . Cholecalciferol (VITAMIN D PO) Take by mouth.    . clidinium-chlordiazePOXIDE (LIBRAX) 2.5-5 MG per capsule     . fexofenadine  (ALLEGRA ALLERGY) 180 MG tablet Take 180 mg by mouth daily.    . meloxicam (MOBIC) 15 MG tablet Take 15 mg by mouth daily. with food  2  . Misc Natural Products (OSTEO BI-FLEX ADV DOUBLE ST PO) Take 400 Int'l Units/day by mouth.    . Multiple Vitamin (MULTI VITAMIN DAILY PO) Take by mouth daily.    . Omeprazole (PRILOSEC PO) Take 40 mg by mouth.     . pravastatin (PRAVACHOL) 20 MG tablet      No current facility-administered medications for this visit.     Family History  Problem Relation Age of Onset  . Osteoporosis Mother   . Dementia Mother   . Heart disease Maternal Grandfather     ROS:  Pertinent items are noted in HPI.  Otherwise, a comprehensive ROS was negative.  Exam:   BP 108/60 (BP Location: Right Arm, Patient Position: Sitting, Cuff Size: Normal)   Pulse 80   Resp 16   Ht 5' 7.25" (1.708 m)   Wt 132 lb (59.9 kg)   LMP 07/21/2002   BMI 20.52 kg/m   Weight change: +3# Height: 5' 7.25" (170.8 cm)  Ht Readings from Last 3 Encounters:  12/09/16 5' 7.25" (1.708 m)  12/20/15 5\' 7"  (1.702 m)  12/07/15 5' 7.25" (1.708 m)    General appearance: alert, cooperative and appears stated age Head: Normocephalic, without obvious abnormality, atraumatic Neck: no adenopathy, supple, symmetrical, trachea midline and thyroid normal to inspection and palpation Lungs: clear to auscultation bilaterally Breasts: normal appearance, no masses or tenderness Heart: regular rate and rhythm Abdomen: soft, non-tender; bowel sounds normal; no masses,  no organomegaly Extremities: extremities normal, atraumatic, no cyanosis or edema Skin: Skin color, texture, turgor normal. No rashes or lesions Lymph nodes: Cervical, supraclavicular, and axillary nodes normal. No abnormal inguinal nodes palpated Neurologic: Grossly normal   Pelvic: External genitalia:  no lesions              Urethra:  normal appearing urethra with no masses, tenderness or lesions              Bartholins and Skenes:  normal                 Vagina: normal appearing vagina with normal color and discharge, no lesions              Cervix: no lesions              Pap taken: Yes.   Bimanual Exam:  Uterus:  normal size, contour, position, consistency, mobility, non-tender              Adnexa: normal adnexa and no mass, fullness, tenderness               Rectovaginal: Confirms               Anus:  normal sphincter tone, no lesions  Chaperone was present for exam.  A:  Well Woman with normal exam H/O TAH H/O DES exposure Celiac disease Osteoporosis on right hip.  Has declined treatment.  Follow up due in September Elevated cholesterol  P:   Mammogram guidelines reviewed.  Doing 3D MMG due to breast density pap smear and HR HPV obtained today Going to get the shingrix from PCP in June Has follow up lab work for cholesterol in June return annually or prn

## 2016-12-10 LAB — CYTOLOGY - PAP
Diagnosis: NEGATIVE
HPV: NOT DETECTED

## 2017-01-05 DIAGNOSIS — M810 Age-related osteoporosis without current pathological fracture: Secondary | ICD-10-CM | POA: Diagnosis not present

## 2017-01-05 DIAGNOSIS — E7800 Pure hypercholesterolemia, unspecified: Secondary | ICD-10-CM | POA: Diagnosis not present

## 2017-01-05 DIAGNOSIS — E559 Vitamin D deficiency, unspecified: Secondary | ICD-10-CM | POA: Diagnosis not present

## 2017-03-17 ENCOUNTER — Ambulatory Visit: Payer: 59 | Admitting: Obstetrics & Gynecology

## 2017-04-17 ENCOUNTER — Telehealth: Payer: Self-pay | Admitting: *Deleted

## 2017-04-17 NOTE — Telephone Encounter (Signed)
-----   Message from Megan Salon, MD sent at 04/17/2017  8:17 AM EDT ----- Regarding: BMD Raquel Sarna, This pt needs a BMD scheduled at [facility].  Can you contact pt and make sure this gets scheduled?  Thanks.  Vinnie Level

## 2017-04-17 NOTE — Telephone Encounter (Signed)
Patient returned call. Patient states she has already scheduled her appointment for her BMD scan on Wednesday 05/06/17 at [facility]. Patient asking if she needed follow up appointment with Dr. Sabra Heck. RN advised once Dr. Sabra Heck had results and reviewed them, she would make any additional recommendations and our office would be in touch at that time. Patient agreeable.   Routing to provider for final review. Patient agreeable to disposition. Will close encounter.

## 2017-04-17 NOTE — Telephone Encounter (Signed)
Call to patient. Patient's husband answered and states patient is in the shower and will call back.

## 2017-05-06 DIAGNOSIS — Z1231 Encounter for screening mammogram for malignant neoplasm of breast: Secondary | ICD-10-CM | POA: Diagnosis not present

## 2017-05-06 DIAGNOSIS — M810 Age-related osteoporosis without current pathological fracture: Secondary | ICD-10-CM | POA: Diagnosis not present

## 2017-05-21 ENCOUNTER — Encounter: Payer: Self-pay | Admitting: Obstetrics & Gynecology

## 2017-06-22 DIAGNOSIS — H524 Presbyopia: Secondary | ICD-10-CM | POA: Diagnosis not present

## 2017-07-09 DIAGNOSIS — Z85828 Personal history of other malignant neoplasm of skin: Secondary | ICD-10-CM | POA: Diagnosis not present

## 2017-07-09 DIAGNOSIS — D2262 Melanocytic nevi of left upper limb, including shoulder: Secondary | ICD-10-CM | POA: Diagnosis not present

## 2017-07-09 DIAGNOSIS — D2261 Melanocytic nevi of right upper limb, including shoulder: Secondary | ICD-10-CM | POA: Diagnosis not present

## 2017-07-09 DIAGNOSIS — L820 Inflamed seborrheic keratosis: Secondary | ICD-10-CM | POA: Diagnosis not present

## 2017-07-23 DIAGNOSIS — R7301 Impaired fasting glucose: Secondary | ICD-10-CM | POA: Diagnosis not present

## 2017-07-23 DIAGNOSIS — E559 Vitamin D deficiency, unspecified: Secondary | ICD-10-CM | POA: Diagnosis not present

## 2017-07-23 DIAGNOSIS — E7800 Pure hypercholesterolemia, unspecified: Secondary | ICD-10-CM | POA: Diagnosis not present

## 2017-08-17 ENCOUNTER — Telehealth: Payer: Self-pay | Admitting: Obstetrics & Gynecology

## 2017-08-17 NOTE — Telephone Encounter (Signed)
Patient is asking if we received her BMD results from October 2018. Patient has not received the result.

## 2017-08-17 NOTE — Telephone Encounter (Signed)
Called to [facility]. Spoke with April to request report from BMD from 04/2017. BMD to be faxed to the office for Dr.Miller's review.

## 2017-08-20 NOTE — Telephone Encounter (Signed)
Spoke with patient. Results given. Scheduled patient for 08/31/2017 at 11:15 am with Dr.Miller. Patient is agreeable to date and time.  Routing to provider for final review. Patient agreeable to disposition. Will close encounter.

## 2017-08-20 NOTE — Telephone Encounter (Signed)
It showed mildly worsening osteoporosis.  I think it is time to discuss treatment options with her.  Ok to make appt.  Thanks.

## 2017-08-31 ENCOUNTER — Encounter: Payer: Self-pay | Admitting: Obstetrics & Gynecology

## 2017-08-31 ENCOUNTER — Other Ambulatory Visit: Payer: Self-pay

## 2017-08-31 ENCOUNTER — Ambulatory Visit: Payer: 59 | Admitting: Obstetrics & Gynecology

## 2017-08-31 VITALS — BP 110/60 | HR 72 | Resp 14 | Ht 67.25 in | Wt 135.0 lb

## 2017-08-31 DIAGNOSIS — M818 Other osteoporosis without current pathological fracture: Secondary | ICD-10-CM | POA: Diagnosis not present

## 2017-08-31 NOTE — Progress Notes (Signed)
Subjective:    84 yrs Married Caucasian G0P0  female here to discuss recent BMD obtained 05/06/17 showing osteoporosis.  Pt had first BMD 10/16.  This showed t score of -3.0 in hip (which has improved to -2.8) as well as T score of -0.5 in spine that has improved to +0.4.  The other hip had osteopenia in it with T score of -2.3 and now has T score of -3.0.  I know pt does not want to be on any medications if possible.  Testing for parathyroid d/o, thyroid d/o done two years ago.  Vit D has been done with PCP.  I do not have these results but will try to obtain them.   As BMD is no worse (overall) than two years ago, seems reasonable to me to monitor and repeat BMD again in two years.  If two of three measurements were decreased, I would also feel like treatment was indicated.    Osteoporosis Risk Factors  Nonmodifiable Personal Hx of fracture as an adult: no Hx of fracture in first-degree relative: yes - mother Caucasian race: yes Advanced age: no Dementia: no Poor health/frailty: no  Potentially modifiable: Tobacco use: no Low body weight (<127 lbs): no Estrogen deficiency  early menopause (age <45) or bilateral ovariectomy: no  prolonged premenopausal amenorrhea (>1 yr): no Low calcium intake (lifelong): yes Alcohol use more than 2 drinks per day: no Recurrent falls: no Inadequate physical activity: no  Current calcium and Vit D intake:  Calcium 1000mg  daily with Vit D 2000 IU  Review of Systems A comprehensive review of systems was negative.     Objective:   PHYSICAL EXAM BP 110/60 (BP Location: Right Arm, Patient Position: Sitting, Cuff Size: Normal)   Pulse 72   Resp 14   Ht 5' 7.25" (1.708 m)   Wt 135 lb (61.2 kg)   LMP 07/21/2002   BMI 20.99 kg/m  General appearance: alert and no distress  Imaging Bone Density: Spine T Score: +0.4, Hip T Score: -3.0 on left and -2.8 on right.  This was done 05/06/17 No FRAX calculated.                                          Assessment:   Osteoporosis with T score -3.0   Plan:   1.  Pt is not interested in medication.  She is going to continue her OTC calcium which she is taking in divided doses and her 2000 IU vit D daily.  She is also going to continue walking 3-5 times weekly.  Aware she does have some fracture risks but is willing to take this risk at this time.   2.  Plan to repeat BMD in two years. 3.  Will have pt sign release to obtained blood work from Dr. Drema Dallas' office.    ~15 minutes spent with patient >50% of time was in face to face discussion of above.

## 2017-12-03 ENCOUNTER — Other Ambulatory Visit: Payer: Self-pay | Admitting: Gastroenterology

## 2017-12-03 DIAGNOSIS — K5730 Diverticulosis of large intestine without perforation or abscess without bleeding: Secondary | ICD-10-CM | POA: Diagnosis not present

## 2017-12-03 DIAGNOSIS — K900 Celiac disease: Secondary | ICD-10-CM | POA: Diagnosis not present

## 2017-12-03 DIAGNOSIS — K219 Gastro-esophageal reflux disease without esophagitis: Secondary | ICD-10-CM | POA: Diagnosis not present

## 2017-12-03 DIAGNOSIS — R1011 Right upper quadrant pain: Secondary | ICD-10-CM

## 2017-12-16 ENCOUNTER — Ambulatory Visit (HOSPITAL_COMMUNITY)
Admission: RE | Admit: 2017-12-16 | Discharge: 2017-12-16 | Disposition: A | Discharge status: Home / Self Care | Payer: 59 | Source: Ambulatory Visit | Attending: Gastroenterology | Admitting: Gastroenterology

## 2017-12-16 DIAGNOSIS — R1011 Right upper quadrant pain: Secondary | ICD-10-CM | POA: Diagnosis not present

## 2017-12-16 DIAGNOSIS — I700 Atherosclerosis of aorta: Secondary | ICD-10-CM | POA: Diagnosis not present

## 2017-12-16 DIAGNOSIS — R109 Unspecified abdominal pain: Secondary | ICD-10-CM | POA: Diagnosis not present

## 2017-12-16 DIAGNOSIS — R1010 Upper abdominal pain, unspecified: Secondary | ICD-10-CM | POA: Diagnosis not present

## 2017-12-16 MED ORDER — TECHNETIUM TC 99M MEBROFENIN IV KIT
5.0000 | PACK | Freq: Once | INTRAVENOUS | Status: AC | PRN
  Administered 2017-12-16: 5 via INTRAVENOUS

## 2017-12-24 DIAGNOSIS — K219 Gastro-esophageal reflux disease without esophagitis: Secondary | ICD-10-CM | POA: Diagnosis not present

## 2017-12-24 DIAGNOSIS — R194 Change in bowel habit: Secondary | ICD-10-CM | POA: Diagnosis not present

## 2017-12-24 DIAGNOSIS — K5730 Diverticulosis of large intestine without perforation or abscess without bleeding: Secondary | ICD-10-CM | POA: Diagnosis not present

## 2018-01-05 DIAGNOSIS — M25561 Pain in right knee: Secondary | ICD-10-CM | POA: Diagnosis not present

## 2018-02-09 ENCOUNTER — Encounter: Payer: Self-pay | Admitting: Obstetrics & Gynecology

## 2018-02-09 ENCOUNTER — Ambulatory Visit: Payer: 59 | Admitting: Obstetrics & Gynecology

## 2018-02-09 ENCOUNTER — Other Ambulatory Visit (HOSPITAL_COMMUNITY)
Admission: RE | Admit: 2018-02-09 | Discharge: 2018-02-09 | Disposition: A | Discharge status: Home / Self Care | Payer: 59 | Source: Ambulatory Visit | Attending: Obstetrics & Gynecology | Admitting: Obstetrics & Gynecology

## 2018-02-09 ENCOUNTER — Other Ambulatory Visit: Payer: Self-pay

## 2018-02-09 VITALS — BP 110/60 | HR 68 | Resp 18 | Ht 67.0 in | Wt 129.0 lb

## 2018-02-09 DIAGNOSIS — Z0000 Encounter for general adult medical examination without abnormal findings: Secondary | ICD-10-CM | POA: Diagnosis not present

## 2018-02-09 DIAGNOSIS — M810 Age-related osteoporosis without current pathological fracture: Secondary | ICD-10-CM | POA: Diagnosis not present

## 2018-02-09 DIAGNOSIS — E559 Vitamin D deficiency, unspecified: Secondary | ICD-10-CM | POA: Diagnosis not present

## 2018-02-09 DIAGNOSIS — R7301 Impaired fasting glucose: Secondary | ICD-10-CM | POA: Diagnosis not present

## 2018-02-09 DIAGNOSIS — E7800 Pure hypercholesterolemia, unspecified: Secondary | ICD-10-CM | POA: Diagnosis not present

## 2018-02-09 DIAGNOSIS — Z9189 Other specified personal risk factors, not elsewhere classified: Secondary | ICD-10-CM | POA: Diagnosis not present

## 2018-02-09 DIAGNOSIS — Z01419 Encounter for gynecological examination (general) (routine) without abnormal findings: Secondary | ICD-10-CM | POA: Diagnosis not present

## 2018-02-09 NOTE — Progress Notes (Signed)
62 y.o. G0P0 [name] here for annual exam.  Doing well.  No vaginal bleeding.  Exercising regularly.  Having some issues with right knee.  Takes Meloxicam prn.  Has arthritis in both knees as well.  Had some RUQ pain.  Had RUQ ultrasound and gall bladder function test.  This was normal.    PCP:  Dr. Drema Dallas.  Did blood work today.   Patient's last menstrual period was 07/21/2002.          Sexually active: No.  The current method of family planning is post menopausal status.    Exercising: Yes.    gym 3 x weekly  Smoker:  no  Health Maintenance: Pap:  12/09/16 Neg. HR HPV:neg  12/20/15 Neg  History of abnormal Pap:  Yes, DES exposure MMG:  05/06/17 BIRADS1:neg Colonoscopy:  06/2015.  Dr. Collene Mares. BMD:   05/06/17 osteoporosis.  Has declined treatment.  Planning to repeat next year  TDaP:  2017 with Dr. Drema Dallas Pneumonia vaccine(s):  no Shingrix:   Pt has been trying to get this to start the series.   Hep C testing: 12/07/15 Neg  Screening Labs: PCP   reports that she has never smoked. She has never used smokeless tobacco. She reports that she drinks alcohol. She reports that she does not use drugs.  Past Medical History:  Diagnosis Date  . Abnormal Pap smear 01/14/96   ASC-US  . Celiac disease   . Cystitis 5/96  . Depression   . DES exposure in utero, unknown    Bx proven adenosis  . Dysmenorrhea   . Fibroid    polyps  . Gluten intolerance   . Menorrhagia   . Osteopenia   . Osteoporosis   . PMS (premenstrual syndrome)   . 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 STD (sexually transmitted disease) 1986   + chlamydia    Past Surgical History:  Procedure Laterality Date  . DILATION AND CURETTAGE OF UTERUS  1974   Irregular vaginal bleeding  . DILATION AND CURETTAGE OF UTERUS  07/2002   Hysterscopy  . MENISCUS REPAIR Left 06/18/2016  . SALIVARY GLAND SURGERY  06/1991   excision of benign salivary gland  . TOTAL ABDOMINAL HYSTERECTOMY  3/04    Current Outpatient  Medications  Medication Sig Dispense Refill  . CALCIUM PO Take 800 mg by mouth daily.     . Cholecalciferol (VITAMIN D PO) Take by mouth.    . fexofenadine (ALLEGRA ALLERGY) 180 MG tablet Take 180 mg by mouth daily.    . meloxicam (MOBIC) 15 MG tablet Take 15 mg by mouth as needed. with food  2  . Misc Natural Products (OSTEO BI-FLEX ADV DOUBLE ST PO) Take 400 Int'l Units/day by mouth.    . Multiple Vitamin (MULTI VITAMIN DAILY PO) Take by mouth daily.    Marland Kitchen omeprazole (PRILOSEC) 20 MG capsule Take 20 mg by mouth 2 (two) times daily.  12  . pravastatin (PRAVACHOL) 20 MG tablet      No current facility-administered medications for this visit.     Family History  Problem Relation Age of Onset  . Osteoporosis Mother   . Dementia Mother   . Heart disease Maternal Grandfather     Review of Systems  Genitourinary:       Night urination   Musculoskeletal: Positive for myalgias.  All other systems reviewed and are negative.   Exam:   BP 110/60 (BP Location: Right Arm, Patient Position: Sitting, Cuff Size: Normal)   Pulse 68  Resp 18   Ht 5\' 7"  (1.702 m)   Wt 129 lb (58.5 kg)   LMP 07/21/2002   BMI 20.20 kg/m    Height: 5\' 7"  (170.2 cm)  Ht Readings from Last 3 Encounters:  02/09/18 5\' 7"  (1.702 m)  08/31/17 5' 7.25" (1.708 m)  12/09/16 5' 7.25" (1.708 m)    General appearance: alert, cooperative and appears stated age Head: Normocephalic, without obvious abnormality, atraumatic Neck: no adenopathy, supple, symmetrical, trachea midline and thyroid normal to inspection and palpation Lungs: clear to auscultation bilaterally Breasts: normal appearance, no masses or tenderness Heart: regular rate and rhythm Abdomen: soft, non-tender; bowel sounds normal; no masses,  no organomegaly Extremities: extremities normal, atraumatic, no cyanosis or edema Skin: Skin color, texture, turgor normal. No rashes or lesions Lymph nodes: Cervical, supraclavicular, and axillary nodes normal. No  abnormal inguinal nodes palpated Neurologic: Grossly normal   Pelvic: External genitalia:  no lesions              Urethra:  normal appearing urethra with no masses, tenderness or lesions              Bartholins and Skenes: normal                 Vagina: normal appearing vagina with normal color and discharge, no lesions              Cervix: absent              Pap taken: Yes.   Bimanual Exam:  Uterus:  normal size, contour, position, consistency, mobility, non-tender              Adnexa: normal adnexa and no mass, fullness, tenderness               Rectovaginal: Confirms               Anus:  normal sphincter tone, no lesions  Chaperone was present for exam.  A:  Well Woman with normal exam PMP, no HRT H/o TAH H/O DES exposure Celiac disease Osteoporosis in right hip.  Not interested in treatment.  Will repeat next year. H/O elevated lipids  P:   Mammogram guidelines reviewed pap smear obtained today Lab work updated today BMD will be done next year return annually or prn

## 2018-02-10 LAB — CYTOLOGY - PAP: Diagnosis: NEGATIVE

## 2018-03-02 ENCOUNTER — Ambulatory Visit: Payer: 59 | Admitting: Obstetrics & Gynecology

## 2018-03-02 ENCOUNTER — Encounter

## 2018-04-15 DIAGNOSIS — M25561 Pain in right knee: Secondary | ICD-10-CM | POA: Diagnosis not present

## 2018-04-20 DIAGNOSIS — M25561 Pain in right knee: Secondary | ICD-10-CM | POA: Diagnosis not present

## 2018-04-27 DIAGNOSIS — M25561 Pain in right knee: Secondary | ICD-10-CM | POA: Diagnosis not present

## 2018-05-10 DIAGNOSIS — Z1231 Encounter for screening mammogram for malignant neoplasm of breast: Secondary | ICD-10-CM | POA: Diagnosis not present

## 2018-05-20 ENCOUNTER — Encounter: Payer: Self-pay | Admitting: Obstetrics & Gynecology

## 2018-06-22 DIAGNOSIS — H524 Presbyopia: Secondary | ICD-10-CM | POA: Diagnosis not present

## 2018-08-19 DIAGNOSIS — K900 Celiac disease: Secondary | ICD-10-CM | POA: Diagnosis not present

## 2018-08-19 DIAGNOSIS — K5730 Diverticulosis of large intestine without perforation or abscess without bleeding: Secondary | ICD-10-CM | POA: Diagnosis not present

## 2018-08-19 DIAGNOSIS — K580 Irritable bowel syndrome with diarrhea: Secondary | ICD-10-CM | POA: Diagnosis not present

## 2018-08-26 DIAGNOSIS — Z85828 Personal history of other malignant neoplasm of skin: Secondary | ICD-10-CM | POA: Diagnosis not present

## 2018-08-26 DIAGNOSIS — L570 Actinic keratosis: Secondary | ICD-10-CM | POA: Diagnosis not present

## 2018-08-26 DIAGNOSIS — D2261 Melanocytic nevi of right upper limb, including shoulder: Secondary | ICD-10-CM | POA: Diagnosis not present

## 2018-08-26 DIAGNOSIS — D2262 Melanocytic nevi of left upper limb, including shoulder: Secondary | ICD-10-CM | POA: Diagnosis not present

## 2018-08-26 DIAGNOSIS — D2239 Melanocytic nevi of other parts of face: Secondary | ICD-10-CM | POA: Diagnosis not present

## 2018-08-26 DIAGNOSIS — D485 Neoplasm of uncertain behavior of skin: Secondary | ICD-10-CM | POA: Diagnosis not present

## 2018-08-26 DIAGNOSIS — D2272 Melanocytic nevi of left lower limb, including hip: Secondary | ICD-10-CM | POA: Diagnosis not present

## 2018-08-26 DIAGNOSIS — L820 Inflamed seborrheic keratosis: Secondary | ICD-10-CM | POA: Diagnosis not present

## 2019-05-09 ENCOUNTER — Telehealth: Payer: Self-pay | Admitting: Obstetrics & Gynecology

## 2019-05-09 NOTE — Telephone Encounter (Signed)
Patient has appointment on Monday for mammogram and would like to confirm if she needs a bone density test as well. Please advise.

## 2019-05-09 NOTE — Telephone Encounter (Signed)
Last AEX  02/09/18 Last BMD 05/06/17 at [facility]: Osteoporosis in right hip, declined treatment Repeat 2 yrs.   Call placed to patient, left detailed message, name identified on voicemail, ok per dpr. Advised as seen above. Advised an order will be faxed to [facility] for BMD, contact them directly to schedule. Return call to office if any additional questions.   BMD order to Dr. Sabra Heck to review and sign.   Routing to provider for final review. Patient is agreeable to disposition. Will close encounter.

## 2019-05-09 NOTE — Telephone Encounter (Signed)
Patient has mammogram scheduled for next week and patients wants to know if she also needs a bone density test this year. Please advise.

## 2019-05-16 ENCOUNTER — Encounter: Payer: Self-pay | Admitting: Obstetrics & Gynecology

## 2019-05-16 DIAGNOSIS — M810 Age-related osteoporosis without current pathological fracture: Secondary | ICD-10-CM | POA: Diagnosis not present

## 2019-05-16 DIAGNOSIS — Z1231 Encounter for screening mammogram for malignant neoplasm of breast: Secondary | ICD-10-CM | POA: Diagnosis not present

## 2019-05-16 DIAGNOSIS — Z8262 Family history of osteoporosis: Secondary | ICD-10-CM | POA: Diagnosis not present

## 2019-05-16 DIAGNOSIS — K900 Celiac disease: Secondary | ICD-10-CM | POA: Diagnosis not present

## 2019-05-16 DIAGNOSIS — Z90710 Acquired absence of both cervix and uterus: Secondary | ICD-10-CM | POA: Diagnosis not present

## 2019-05-26 DIAGNOSIS — H1132 Conjunctival hemorrhage, left eye: Secondary | ICD-10-CM | POA: Diagnosis not present

## 2019-05-31 ENCOUNTER — Other Ambulatory Visit: Payer: Self-pay

## 2019-06-02 ENCOUNTER — Encounter: Payer: Self-pay | Admitting: Obstetrics & Gynecology

## 2019-06-02 ENCOUNTER — Other Ambulatory Visit (HOSPITAL_COMMUNITY)
Admission: RE | Admit: 2019-06-02 | Discharge: 2019-06-02 | Disposition: A | Discharge status: Home / Self Care | Payer: BC Managed Care – PPO | Source: Ambulatory Visit | Attending: Obstetrics & Gynecology | Admitting: Obstetrics & Gynecology

## 2019-06-02 ENCOUNTER — Other Ambulatory Visit: Payer: Self-pay

## 2019-06-02 ENCOUNTER — Ambulatory Visit: Payer: BC Managed Care – PPO | Admitting: Obstetrics & Gynecology

## 2019-06-02 VITALS — BP 128/76 | HR 84 | Temp 97.4°F | Resp 12 | Ht 66.75 in | Wt 126.4 lb

## 2019-06-02 DIAGNOSIS — Z01419 Encounter for gynecological examination (general) (routine) without abnormal findings: Secondary | ICD-10-CM

## 2019-06-02 DIAGNOSIS — Z124 Encounter for screening for malignant neoplasm of cervix: Secondary | ICD-10-CM | POA: Insufficient documentation

## 2019-06-02 DIAGNOSIS — Z9189 Other specified personal risk factors, not elsewhere classified: Secondary | ICD-10-CM | POA: Insufficient documentation

## 2019-06-02 NOTE — Progress Notes (Signed)
63 y.o. G0P0 Married White or Caucasian female here for annual exam.  Doing well.  Denies vaginal bleeding.  Thinking about getting the shingrix vaccination.  Questions answered.  Patient's last menstrual period was 07/21/2002.          Sexually active: No.  The current method of family planning is post menopausal status and hysterectomy.    Exercising: Yes.    walking Smoker:  no  Health Maintenance: Pap:   02/09/18 Neg  12/09/16 Neg. HR HPV:neg             12/20/15 Neg  History of abnormal Pap:  Yes, DES exposure MMG:  05/10/18 BIRADS 1 negative/density d Colonoscopy:  06/2015.  Dr. Collene Mares.  Repeat 10 years. BMD:   05/16/19 - will call [facility] for results TDaP:  2017 with PCP Pneumonia vaccine(s):  No Flu vaccination:  Completed per pt Shingrix:   Discuss today Hep C testing: 12/07/15 Neg Screening Labs: PCP   reports that she has never smoked. She has never used smokeless tobacco. She reports current alcohol use. She reports that she does not use drugs.  Past Medical History:  Diagnosis Date  . Abnormal Pap smear 01/14/96   ASC-US  . Celiac disease   . Cystitis 5/96  . Depression   . DES exposure in utero, unknown    Bx proven adenosis  . Dysmenorrhea   . Fibroid    polyps  . Gluten intolerance   . Menorrhagia   . Osteoporosis   . 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 STD (sexually transmitted disease) 1986   + chlamydia    Past Surgical History:  Procedure Laterality Date  . DILATION AND CURETTAGE OF UTERUS  1974   Irregular vaginal bleeding  . DILATION AND CURETTAGE OF UTERUS  07/2002   Hysterscopy  . MENISCUS REPAIR Left 06/18/2016  . SALIVARY GLAND SURGERY  06/1991   excision of benign salivary gland  . TOTAL ABDOMINAL HYSTERECTOMY  3/04    Current Outpatient Medications  Medication Sig Dispense Refill  . CALCIUM PO Take 800 mg by mouth daily.     . Cholecalciferol (VITAMIN D PO) Take by mouth.    . fexofenadine (ALLEGRA ALLERGY) 180 MG tablet Take 180 mg by mouth  daily.    . Glucosamine HCl-MSM (GLUCOSAMINE-MSM PO) Take by mouth daily.    . meloxicam (MOBIC) 15 MG tablet Take 15 mg by mouth as needed. with food  2  . Misc Natural Products (OSTEO BI-FLEX ADV DOUBLE ST PO) Take 400 Int'l Units/day by mouth.    . Multiple Vitamin (MULTI VITAMIN DAILY PO) Take by mouth daily.    Marland Kitchen omeprazole (PRILOSEC) 20 MG capsule Take 20 mg by mouth 2 (two) times daily.  12  . pravastatin (PRAVACHOL) 20 MG tablet      No current facility-administered medications for this visit.     Family History  Problem Relation Age of Onset  . Osteoporosis Mother   . Dementia Mother   . Heart disease Maternal Grandfather     Review of Systems  All other systems reviewed and are negative.   Exam:   BP 128/76 (BP Location: Right Arm, Patient Position: Sitting, Cuff Size: Normal)   Pulse 84   Temp (!) 97.4 F (36.3 C) (Temporal)   Resp 12   Ht 5' 6.75" (1.695 m)   Wt 126 lb 6.4 oz (57.3 kg)   LMP 07/21/2002   BMI 19.95 kg/m    Height: 5' 6.75" (169.5 cm)  Ht Readings from Last 3 Encounters:  06/02/19 5' 6.75" (1.695 m)  02/09/18 5\' 7"  (1.702 m)  08/31/17 5' 7.25" (1.708 m)    General appearance: alert, cooperative and appears stated age Head: Normocephalic, without obvious abnormality, atraumatic Neck: no adenopathy, supple, symmetrical, trachea midline and thyroid normal to inspection and palpation Lungs: clear to auscultation bilaterally Breasts: normal appearance, no masses or tenderness Heart: regular rate and rhythm Abdomen: soft, non-tender; bowel sounds normal; no masses,  no organomegaly Extremities: extremities normal, atraumatic, no cyanosis or edema Skin: Skin color, texture, turgor normal. No rashes or lesions Lymph nodes: Cervical, supraclavicular, and axillary nodes normal. No abnormal inguinal nodes palpated Neurologic: Grossly normal   Pelvic: External genitalia:  no lesions              Urethra:  normal appearing urethra with no masses,  tenderness or lesions              Bartholins and Skenes: normal                 Vagina: normal appearing vagina with normal color and discharge, no lesions              Cervix: absent              Pap taken: No. Bimanual Exam:  Uterus:  uterus absent              Adnexa: no mass, fullness, tenderness               Rectovaginal: Confirms               Anus:  normal sphincter tone, no lesions  Chaperone was present for exam.  A:  Well Woman with normal exam PMP, no HRT H/o DES exposure with h/o TAH Celiac disease Osteoporosis in right hip.  Copy of BMD requested today from [facility].  Elevated lipids  P:   Mammogram guidelines reviewed.   pap smear obtained today Lab work will be planned with Dr. Drema Dallas in December Shingrix vaccination discussed Colonoscopy is UTD Will get BMD copy from [facility] Return annually or prn

## 2019-06-03 LAB — CYTOLOGY - PAP: Diagnosis: NEGATIVE

## 2019-06-11 ENCOUNTER — Other Ambulatory Visit: Payer: Self-pay | Admitting: Obstetrics & Gynecology

## 2019-07-01 DIAGNOSIS — E7800 Pure hypercholesterolemia, unspecified: Secondary | ICD-10-CM | POA: Diagnosis not present

## 2019-07-01 DIAGNOSIS — R7303 Prediabetes: Secondary | ICD-10-CM | POA: Diagnosis not present

## 2019-07-01 DIAGNOSIS — Z0000 Encounter for general adult medical examination without abnormal findings: Secondary | ICD-10-CM | POA: Diagnosis not present

## 2019-07-01 DIAGNOSIS — E559 Vitamin D deficiency, unspecified: Secondary | ICD-10-CM | POA: Diagnosis not present

## 2019-07-19 DIAGNOSIS — H524 Presbyopia: Secondary | ICD-10-CM | POA: Diagnosis not present

## 2019-08-11 DIAGNOSIS — M25561 Pain in right knee: Secondary | ICD-10-CM | POA: Diagnosis not present

## 2019-08-31 DIAGNOSIS — S0502XA Injury of conjunctiva and corneal abrasion without foreign body, left eye, initial encounter: Secondary | ICD-10-CM | POA: Diagnosis not present

## 2019-09-28 DIAGNOSIS — E559 Vitamin D deficiency, unspecified: Secondary | ICD-10-CM | POA: Diagnosis not present

## 2019-11-01 DIAGNOSIS — K5730 Diverticulosis of large intestine without perforation or abscess without bleeding: Secondary | ICD-10-CM | POA: Diagnosis not present

## 2019-11-01 DIAGNOSIS — K219 Gastro-esophageal reflux disease without esophagitis: Secondary | ICD-10-CM | POA: Diagnosis not present

## 2019-11-01 DIAGNOSIS — K900 Celiac disease: Secondary | ICD-10-CM | POA: Diagnosis not present

## 2019-11-03 DIAGNOSIS — D2239 Melanocytic nevi of other parts of face: Secondary | ICD-10-CM | POA: Diagnosis not present

## 2019-11-03 DIAGNOSIS — D2262 Melanocytic nevi of left upper limb, including shoulder: Secondary | ICD-10-CM | POA: Diagnosis not present

## 2019-11-03 DIAGNOSIS — D2261 Melanocytic nevi of right upper limb, including shoulder: Secondary | ICD-10-CM | POA: Diagnosis not present

## 2019-11-03 DIAGNOSIS — Z85828 Personal history of other malignant neoplasm of skin: Secondary | ICD-10-CM | POA: Diagnosis not present

## 2019-11-20 IMAGING — NM NM HEPATO W/GB/PHARM/[PERSON_NAME]
3 series · 13 of 13 positions shown · non-contrast
Comparison: None.

CLINICAL DATA: Abdominal pain and nausea

EXAM:
NUCLEAR MEDICINE HEPATOBILIARY IMAGING WITH GALLBLADDER EF
VIEWS:
Anterior, right lateral right upper quadrant
RADIOPHARMACEUTICALS:  5.2 mCi 8c-BBm  Choletec IV

[he hepatobiliary · 3.10mm/px · 6 of 60 frames shown (1 of 3)]
[frame 6/60]
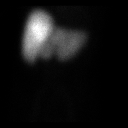
[frame 16/60]
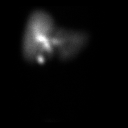
[frame 26/60]
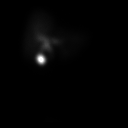
[frame 36/60]
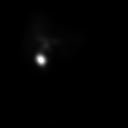
[frame 46/60]
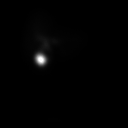
[frame 56/60]
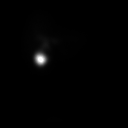

[he hepatobiliary · 2.26mm/px · 1 of 1 slices shown (2 of 3)]
[im 1/1]
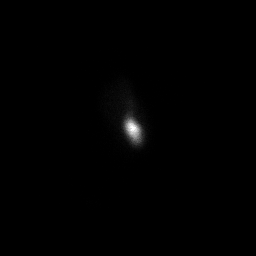

[he hepatobiliary · 3.10mm/px · 6 of 60 frames shown (3 of 3)]
[frame 6/60]
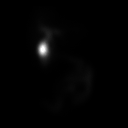
[frame 16/60]
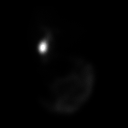
[frame 26/60]
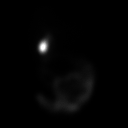
[frame 36/60]
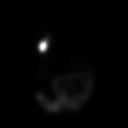
[frame 46/60]
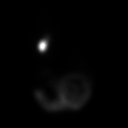
[frame 56/60]
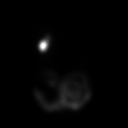

[13 of 13 positions shown; findings below may reference images not displayed]

FINDINGS: Liver uptake of radiotracer is normal. There is prompt visualization
of gallbladder and small bowel, indicating patency of the cystic and
common bile ducts. Patient received 8 ounces of Ensure orally with
calculation of the computer generated ejection fraction of
radiotracer the gallbladder. No report of clinical symptoms with the
oral Ensure consumption. The computer generated ejection fraction of
radiotracer is normal at 48%, normal greater than 33% using the oral
agent.
IMPRESSION: Study within normal limits.

## 2019-12-27 DIAGNOSIS — Z20822 Contact with and (suspected) exposure to covid-19: Secondary | ICD-10-CM | POA: Diagnosis not present

## 2020-05-15 ENCOUNTER — Other Ambulatory Visit: Payer: Self-pay | Admitting: Orthopedic Surgery

## 2020-05-15 DIAGNOSIS — M542 Cervicalgia: Secondary | ICD-10-CM

## 2020-05-19 ENCOUNTER — Ambulatory Visit: Payer: Self-pay | Attending: Internal Medicine

## 2020-05-19 DIAGNOSIS — Z23 Encounter for immunization: Secondary | ICD-10-CM

## 2020-05-19 NOTE — Progress Notes (Signed)
   Covid-19 Vaccination Clinic  Name:  Cheryl Reynolds    MRN: [id number] DOB: 1955-11-26  05/19/2020  Cheryl Reynolds was observed post Covid-19 immunization for 30 minutes based on pre-vaccination screening without incident. She was provided with Vaccine Information Sheet and instruction to access the V-Safe system.   Cheryl Reynolds was instructed to call 911 with any severe reactions post vaccine: Marland Kitchen Difficulty breathing  . Swelling of face and throat  . A fast heartbeat  . A bad rash all over body  . Dizziness and weakness

## 2020-06-07 ENCOUNTER — Other Ambulatory Visit: Payer: Self-pay

## 2020-06-07 ENCOUNTER — Ambulatory Visit
Admission: RE | Admit: 2020-06-07 | Discharge: 2020-06-07 | Disposition: A | Discharge status: Home / Self Care | Payer: Medicare Other | Source: Ambulatory Visit | Attending: Orthopedic Surgery | Admitting: Orthopedic Surgery

## 2020-06-07 DIAGNOSIS — M542 Cervicalgia: Secondary | ICD-10-CM

## 2020-08-31 ENCOUNTER — Ambulatory Visit: Payer: BC Managed Care – PPO

## 2020-11-18 HISTORY — PX: CATARACT EXTRACTION: SUR2

## 2020-11-20 ENCOUNTER — Ambulatory Visit: Payer: Medicare Other | Attending: Internal Medicine

## 2020-11-20 ENCOUNTER — Other Ambulatory Visit: Payer: Self-pay

## 2020-11-20 ENCOUNTER — Other Ambulatory Visit (HOSPITAL_BASED_OUTPATIENT_CLINIC_OR_DEPARTMENT_OTHER): Payer: Self-pay

## 2020-11-20 DIAGNOSIS — Z23 Encounter for immunization: Secondary | ICD-10-CM

## 2020-11-20 MED ORDER — PFIZER-BIONT COVID-19 VAC-TRIS 30 MCG/0.3ML IM SUSP
INTRAMUSCULAR | 0 refills | Status: DC
  Filled 2020-11-20: qty 0.3, 1d supply, fill #0

## 2020-11-20 NOTE — Progress Notes (Signed)
   Covid-19 Vaccination Clinic  Name:  Cheryl Reynolds    MRN: [id number] DOB: 1956/05/08  11/20/2020  Ms. Cheryl Reynolds was observed post Covid-19 immunization for 15 minutes without incident. She was provided with Vaccine Information Sheet and instruction to access the V-Safe system.   Ms. Cheryl Reynolds was instructed to call 911 with any severe reactions post vaccine: Marland Kitchen Difficulty breathing  . Swelling of face and throat  . A fast heartbeat  . A bad rash all over body  . Dizziness and weakness   Immunizations Administered    Name Date Dose VIS Date Route   PFIZER Comrnaty(Gray TOP) Covid-19 Vaccine 11/20/2020 11:54 AM 0.3 mL 06/28/2020 Intramuscular   Manufacturer: [place]   Lot: [id number]   NDC: [phone]

## 2020-12-11 ENCOUNTER — Other Ambulatory Visit: Payer: Self-pay

## 2020-12-11 ENCOUNTER — Encounter (HOSPITAL_BASED_OUTPATIENT_CLINIC_OR_DEPARTMENT_OTHER): Payer: Self-pay | Admitting: Obstetrics & Gynecology

## 2020-12-11 ENCOUNTER — Ambulatory Visit (INDEPENDENT_AMBULATORY_CARE_PROVIDER_SITE_OTHER): Payer: Medicare Other | Admitting: Obstetrics & Gynecology

## 2020-12-11 ENCOUNTER — Other Ambulatory Visit (HOSPITAL_COMMUNITY)
Admission: RE | Admit: 2020-12-11 | Discharge: 2020-12-11 | Disposition: A | Discharge status: Home / Self Care | Payer: Medicare Other | Source: Ambulatory Visit | Attending: Obstetrics & Gynecology | Admitting: Obstetrics & Gynecology

## 2020-12-11 VITALS — BP 124/70 | HR 78 | Ht 66.5 in | Wt 122.0 lb

## 2020-12-11 DIAGNOSIS — Z780 Asymptomatic menopausal state: Secondary | ICD-10-CM | POA: Diagnosis not present

## 2020-12-11 DIAGNOSIS — Z8619 Personal history of other infectious and parasitic diseases: Secondary | ICD-10-CM

## 2020-12-11 DIAGNOSIS — K589 Irritable bowel syndrome without diarrhea: Secondary | ICD-10-CM | POA: Insufficient documentation

## 2020-12-11 DIAGNOSIS — K900 Celiac disease: Secondary | ICD-10-CM | POA: Diagnosis not present

## 2020-12-11 DIAGNOSIS — M810 Age-related osteoporosis without current pathological fracture: Secondary | ICD-10-CM | POA: Insufficient documentation

## 2020-12-11 DIAGNOSIS — Z9189 Other specified personal risk factors, not elsewhere classified: Secondary | ICD-10-CM

## 2020-12-11 DIAGNOSIS — E559 Vitamin D deficiency, unspecified: Secondary | ICD-10-CM | POA: Insufficient documentation

## 2020-12-11 DIAGNOSIS — M818 Other osteoporosis without current pathological fracture: Secondary | ICD-10-CM

## 2020-12-11 DIAGNOSIS — E7800 Pure hypercholesterolemia, unspecified: Secondary | ICD-10-CM | POA: Insufficient documentation

## 2020-12-11 DIAGNOSIS — R7303 Prediabetes: Secondary | ICD-10-CM | POA: Insufficient documentation

## 2020-12-11 NOTE — Progress Notes (Signed)
65 y.o. G0P0 Married White or Caucasian female here for breast and pelvic exam.  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  Reviewed current recommendations.  Denies vaginal bleeding.  Patient's last menstrual period was 07/21/2002.          Sexually active: No.  H/O STD:  1986, +chlamydia  Health Maintenance: PCP:  Dr. Drema Dallas.  Last wellness appt was 06/2020.  Did blood work at that appt: yes Vaccines are up to date:  yes Colonoscopy:  2016 with Dr. Collene Mares.  10 year follow up recommended.   MMG:  04/2019.   BMD:  Needs done again this year.  Order will be faxed to [facility]. Last pap smear:  06/02/2019 H/o abnormal pap smear: no   reports that she has never smoked. She has never used smokeless tobacco. She reports current alcohol use. She reports that she does not use drugs.  Past Medical History:  Diagnosis Date  . Abnormal Pap smear 01/14/96   ASC-US  . Celiac disease   . Cystitis 5/96  . Depression   . DES exposure in utero, unknown    Bx proven adenosis  . Dysmenorrhea   . Fibroid    polyps  . Gluten intolerance   . Menorrhagia   . Osteoporosis   . 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 STD (sexually transmitted disease) 1986   + chlamydia    Past Surgical History:  Procedure Laterality Date  . CATARACT EXTRACTION Left 11/2020  . DILATION AND CURETTAGE OF UTERUS  1974   Irregular vaginal bleeding  . DILATION AND CURETTAGE OF UTERUS  07/2002   Hysterscopy  . MENISCUS REPAIR Left 06/18/2016  . SALIVARY GLAND SURGERY  06/1991   excision of benign salivary gland  . TOTAL ABDOMINAL HYSTERECTOMY  3/04    Current Outpatient Medications  Medication Sig Dispense Refill  . CALCIUM PO Take 800 mg by mouth daily.     . Cholecalciferol (VITAMIN D PO) Take by mouth.    Marland Kitchen COVID-19 mRNA Vac-TriS, Pfizer, (PFIZER-BIONT COVID-19 VAC-TRIS) SUSP injection Inject into the muscle. 0.3 mL 0  . fexofenadine (ALLEGRA) 180 MG tablet Take 180 mg by mouth daily.    . Glucosamine HCl-MSM (GLUCOSAMINE-MSM  PO) Take by mouth daily.    . meloxicam (MOBIC) 15 MG tablet Take 15 mg by mouth as needed. with food  2  . Multiple Vitamin (MULTI VITAMIN DAILY PO) Take by mouth daily.    Marland Kitchen omeprazole (PRILOSEC) 20 MG capsule Take 20 mg by mouth 2 (two) times daily.  12  . pravastatin (PRAVACHOL) 20 MG tablet     . Misc Natural Products (OSTEO BI-FLEX ADV DOUBLE ST PO) Take 400 Int'l Units/day by mouth. (Patient not taking: Reported on 12/11/2020)     No current facility-administered medications for this visit.    Family History  Problem Relation Age of Onset  . Osteoporosis Mother   . Dementia Mother   . Heart disease Maternal Grandfather     Review of Systems  Constitutional: Negative.   Gastrointestinal: Negative.   Genitourinary: Negative.   Psychiatric/Behavioral: Negative.     Exam:   BP 124/70   Pulse 78   Ht 5' 6.5" (1.689 m)   Wt 122 lb (55.3 kg)   LMP 07/21/2002   BMI 19.40 kg/m   Height: 5' 6.5" (168.9 cm)  General appearance: alert, cooperative and appears stated age Breasts: normal appearance, no masses or tenderness Abdomen: soft, non-tender; bowel sounds normal; no masses,  no organomegaly  Lymph nodes: Cervical, supraclavicular, and axillary nodes normal.  No abnormal inguinal nodes palpated Neurologic: Grossly normal  Pelvic: External genitalia:  no lesions              Urethra:  normal appearing urethra with no masses, tenderness or lesions              Bartholins and Skenes: normal                 Vagina: normal appearing vagina with atrophic changes and no discharge, no lesions              Cervix: absent              Pap taken: Yes.   Bimanual Exam:  Uterus:  uterus absent              Adnexa: no mass, fullness, tenderness               Rectovaginal: Confirms               Anus:  normal sphincter tone, no lesions  Chaperone, Octaviano Batty, CMA, was present for exam.  Assessment/Plan: 1. GYN exam for high-risk Medicare patient - Pap smear obtained today with  obtaining sample from all four quadrants of the vagina - MMG 04/2019 in chart.  Pt states did last year and will call [facility] to obtain - Colonoscoyp up to date with Dr. Collene Mares 2016.  10 year follow up recommended. - BMD order needed and will be faxed to [facility] (done today) - vaccines updated - Lab work done with Dr. Drema Dallas 06/2020  2. Postmenopausal - no HRT  3. DES exposure in utero - Cytology - PAP( [place])  4. Celiac disease  5. Other osteoporosis without current pathological fracture - BMD order faxed to [facility] today.  6.  H/o STD - chlamydia hx

## 2020-12-12 DIAGNOSIS — Z9189 Other specified personal risk factors, not elsewhere classified: Secondary | ICD-10-CM | POA: Insufficient documentation

## 2020-12-12 DIAGNOSIS — Z780 Asymptomatic menopausal state: Secondary | ICD-10-CM | POA: Insufficient documentation

## 2020-12-12 DIAGNOSIS — Z91B Personal risk factor of exposure to diethylstilbestrol: Secondary | ICD-10-CM | POA: Insufficient documentation

## 2020-12-12 LAB — CYTOLOGY - PAP: Diagnosis: NEGATIVE

## 2021-01-10 ENCOUNTER — Encounter (HOSPITAL_BASED_OUTPATIENT_CLINIC_OR_DEPARTMENT_OTHER): Payer: Self-pay | Admitting: Obstetrics & Gynecology

## 2021-05-24 ENCOUNTER — Encounter (HOSPITAL_BASED_OUTPATIENT_CLINIC_OR_DEPARTMENT_OTHER): Payer: Self-pay | Admitting: *Deleted

## 2021-05-28 ENCOUNTER — Encounter (HOSPITAL_BASED_OUTPATIENT_CLINIC_OR_DEPARTMENT_OTHER): Payer: Self-pay | Admitting: *Deleted

## 2021-05-29 ENCOUNTER — Ambulatory Visit
Admission: RE | Admit: 2021-05-29 | Discharge: 2021-05-29 | Disposition: A | Discharge status: Home / Self Care | Payer: Medicare Other | Source: Ambulatory Visit | Attending: Home Modifications | Admitting: Home Modifications

## 2021-05-29 ENCOUNTER — Other Ambulatory Visit: Payer: Self-pay | Admitting: Home Modifications

## 2021-05-29 DIAGNOSIS — M7989 Other specified soft tissue disorders: Secondary | ICD-10-CM

## 2021-05-30 ENCOUNTER — Encounter (HOSPITAL_BASED_OUTPATIENT_CLINIC_OR_DEPARTMENT_OTHER): Payer: Self-pay | Admitting: Obstetrics & Gynecology

## 2021-05-30 ENCOUNTER — Other Ambulatory Visit: Payer: Self-pay

## 2021-05-30 ENCOUNTER — Ambulatory Visit (INDEPENDENT_AMBULATORY_CARE_PROVIDER_SITE_OTHER): Payer: Medicare Other | Admitting: Obstetrics & Gynecology

## 2021-05-30 VITALS — BP 130/66 | HR 68 | Ht 66.0 in | Wt 122.8 lb

## 2021-05-30 DIAGNOSIS — K219 Gastro-esophageal reflux disease without esophagitis: Secondary | ICD-10-CM | POA: Diagnosis not present

## 2021-05-30 DIAGNOSIS — M818 Other osteoporosis without current pathological fracture: Secondary | ICD-10-CM

## 2021-05-30 DIAGNOSIS — M810 Age-related osteoporosis without current pathological fracture: Secondary | ICD-10-CM

## 2021-05-30 NOTE — Progress Notes (Signed)
Subjective:    55 yrs Married Caucasian G0P0  female here to discuss recent BMD obtained 05/22/2021 showing osteoporosis.  T score was -3.6 in her right femoral neck.   Last BMD was 2020 showing osteoporosis with t score of -3.0.    Osteoporosis Risk Factors  Nonmodifiable Personal Hx of fracture as an adult: no Hx of fracture in first-degree relative: yes - mother with hx of osteoporosis Caucasian race: yes Dementia: no Poor health/frailty: no   Potentially modifiable: Tobacco use: no Low body weight (<127 lbs): yes Estrogen deficiency with either early menopause (age <45) or bilateral ovariectomy: no Low calcium intake (lifelong): no Alcohol use more than 2 drinks per day: no Recurrent falls: no Inadequate physical activity: no  Current calcium and Vit D intake:  Calcium 1100mg  with Vit D  Past Medical History:  Diagnosis Date   Abnormal Pap smear 01/14/96   ASC-US   Celiac disease    Cystitis 5/96   Depression    DES exposure in utero, unknown    Bx proven adenosis   Dysmenorrhea    Fibroid    polyps   Gluten intolerance    Menorrhagia    Osteoporosis    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      fexofenadine (ALLEGRA) 180 MG tablet Take 180 mg by mouth daily.     Glucosamine HCl-MSM (GLUCOSAMINE-MSM PO) Take by mouth daily.     meloxicam (MOBIC) 15 MG tablet Take 15 mg by mouth as needed. with food  2   Multiple Vitamin (MULTI VITAMIN DAILY PO) Take by mouth daily.     omeprazole (PRILOSEC) 20 MG capsule Take 20 mg by mouth 2 (two) times daily.  12   pravastatin (PRAVACHOL) 20 MG tablet      Cholecalciferol (VITAMIN D PO) Take by mouth. (Patient not taking: Reported on 05/30/2021)     No current facility-administered medications on file prior to visit.   Allergies  Allergen Reactions    Gluten Meal    Sulfa Antibiotics Hives    Review of Systems Pertinent items are noted in HPI.     Objective:   PHYSICAL EXAM BP 130/66   Pulse 68   Ht 5\' 6"  (1.676 m)   Wt 122 lb 12.8 oz (55.7 kg)   LMP 07/21/2002   BMI 19.82 kg/m  General appearance: alert and no distress  Imaging Bone Density: Spine T Score: nornmal, Hip T Score: -3.6   Done on 05/22/2021 FRAX score:  not calculated                                       Assessment:   Osteoporosis with T score -3.6   Plan:   1.  Patient counseled in adequate calcium and vitamin D exposure.  Calcium - 500 - 1000 mg elemental calcium/day in divided doses  Vitamin D - 800 IU/day 2.  Exercise recommended at least 30 minutes 3 times per week.  3.  Recommendation to avoid heavy ETOH use.  4.  Fall prevention discussed. 5.  Lab work ordered today:  PTH, CMP, Vit D, TSH 6.  Pharmacologic therapy therapy below discussed including risks and benefits:   Bisphosphonates po (Fosamax, Actonel, Boniva)  Bisphosphonate IV (Reclast)  Evista  Prolia subcutaneous  7.  Repeat bone density in 2 years.  Total time with pt:  18 minutes

## 2021-06-01 LAB — PARATHYROID HORMONE, INTACT (NO CA): PTH: 26 pg/mL (ref 15–65)

## 2021-06-01 LAB — COMPREHENSIVE METABOLIC PANEL
ALT: 27 IU/L (ref 0–32)
AST: 30 IU/L (ref 0–40)
Albumin/Globulin Ratio: 2.4 — ABNORMAL HIGH (ref 1.2–2.2)
Albumin: 4.4 g/dL (ref 3.8–4.8)
Alkaline Phosphatase: 78 IU/L (ref 44–121)
BUN/Creatinine Ratio: 25 (ref 12–28)
BUN: 19 mg/dL (ref 8–27)
Bilirubin Total: 0.3 mg/dL (ref 0.0–1.2)
CO2: 26 mmol/L (ref 20–29)
Calcium: 9.7 mg/dL (ref 8.7–10.3)
Chloride: 103 mmol/L (ref 96–106)
Creatinine, Ser: 0.76 mg/dL (ref 0.57–1.00)
Globulin, Total: 1.8 g/dL (ref 1.5–4.5)
Glucose: 88 mg/dL (ref 70–99)
Potassium: 4.4 mmol/L (ref 3.5–5.2)
Sodium: 141 mmol/L (ref 134–144)
Total Protein: 6.2 g/dL (ref 6.0–8.5)
eGFR: 86 mL/min/{1.73_m2} (ref >59)

## 2021-06-01 LAB — TSH: TSH: 1.07 u[IU]/mL (ref 0.450–4.500)

## 2021-06-01 LAB — VITAMIN D 25 HYDROXY (VIT D DEFICIENCY, FRACTURES): Vit D, 25-Hydroxy: 48.1 ng/mL (ref 30.0–100.0)

## 2021-06-06 ENCOUNTER — Other Ambulatory Visit (HOSPITAL_BASED_OUTPATIENT_CLINIC_OR_DEPARTMENT_OTHER): Payer: Self-pay

## 2021-06-06 ENCOUNTER — Ambulatory Visit: Payer: Medicare Other | Attending: Internal Medicine

## 2021-06-06 DIAGNOSIS — Z23 Encounter for immunization: Secondary | ICD-10-CM

## 2021-06-06 MED ORDER — PFIZER COVID-19 VAC BIVALENT 30 MCG/0.3ML IM SUSP
INTRAMUSCULAR | 0 refills | Status: DC
  Filled 2021-06-06: qty 0.3, 1d supply, fill #0

## 2021-06-06 NOTE — Progress Notes (Signed)
   Covid-19 Vaccination Clinic  Name:  RAVINDER HOFLAND    MRN: [id number] DOB: Jan 02, 1956  06/06/2021  Ms. Vanzandt was observed post Covid-19 immunization for 15 minutes without incident. She was provided with Vaccine Information Sheet and instruction to access the V-Safe system.   Ms. Addison was instructed to call 911 with any severe reactions post vaccine: Difficulty breathing  Swelling of face and throat  A fast heartbeat  A bad rash all over body  Dizziness and weakness   Immunizations Administered     Name Date Dose VIS Date Route   Pfizer Covid-19 Vaccine Bivalent Booster 06/06/2021 11:59 AM 0.3 mL 03/20/2021 Intramuscular   Manufacturer: [place]   Lot: [id number]   [place]: [phone]

## 2021-06-10 ENCOUNTER — Ambulatory Visit (HOSPITAL_BASED_OUTPATIENT_CLINIC_OR_DEPARTMENT_OTHER): Payer: BLUE CROSS/BLUE SHIELD | Admitting: Obstetrics & Gynecology

## 2021-06-28 ENCOUNTER — Other Ambulatory Visit: Payer: Self-pay

## 2021-06-28 ENCOUNTER — Ambulatory Visit (INDEPENDENT_AMBULATORY_CARE_PROVIDER_SITE_OTHER): Payer: Medicare Other

## 2021-06-28 DIAGNOSIS — M810 Age-related osteoporosis without current pathological fracture: Secondary | ICD-10-CM

## 2021-06-28 MED ORDER — DENOSUMAB 60 MG/ML ~~LOC~~ SOSY
60.0000 mg | PREFILLED_SYRINGE | Freq: Once | SUBCUTANEOUS | Status: AC
  Administered 2021-06-28: 60 mg via SUBCUTANEOUS

## 2021-06-28 NOTE — Progress Notes (Signed)
Patient came in today to receive Prolia injection.   Prolia 60mg  given in the upper, back right arm. Patient was observed for 15 minutes and tolerated injection well. Tbw   Prolia 60mg  [employer] EAK:35075-732-25 OHC:[id number] Exp:11/18/2023

## 2021-08-22 ENCOUNTER — Encounter (HOSPITAL_BASED_OUTPATIENT_CLINIC_OR_DEPARTMENT_OTHER): Payer: Self-pay | Admitting: Obstetrics & Gynecology

## 2021-09-04 ENCOUNTER — Other Ambulatory Visit: Payer: Self-pay

## 2021-09-04 ENCOUNTER — Encounter (HOSPITAL_BASED_OUTPATIENT_CLINIC_OR_DEPARTMENT_OTHER): Payer: Self-pay | Admitting: Emergency Medicine

## 2021-09-04 ENCOUNTER — Emergency Department (HOSPITAL_BASED_OUTPATIENT_CLINIC_OR_DEPARTMENT_OTHER)
Admission: EM | Admit: 2021-09-04 | Discharge: 2021-09-04 | Disposition: A | Discharge status: Home / Self Care | Payer: No Typology Code available for payment source | Attending: Emergency Medicine | Admitting: Emergency Medicine

## 2021-09-04 ENCOUNTER — Emergency Department (HOSPITAL_BASED_OUTPATIENT_CLINIC_OR_DEPARTMENT_OTHER): Payer: No Typology Code available for payment source | Admitting: Radiology

## 2021-09-04 DIAGNOSIS — R0789 Other chest pain: Secondary | ICD-10-CM | POA: Insufficient documentation

## 2021-09-04 DIAGNOSIS — Y92410 Unspecified street and highway as the place of occurrence of the external cause: Secondary | ICD-10-CM | POA: Insufficient documentation
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
## 2021-12-19 ENCOUNTER — Ambulatory Visit (INDEPENDENT_AMBULATORY_CARE_PROVIDER_SITE_OTHER): Payer: Medicare Other | Admitting: Obstetrics & Gynecology

## 2021-12-19 ENCOUNTER — Encounter (HOSPITAL_BASED_OUTPATIENT_CLINIC_OR_DEPARTMENT_OTHER): Payer: Self-pay | Admitting: Obstetrics & Gynecology

## 2021-12-19 ENCOUNTER — Other Ambulatory Visit (HOSPITAL_COMMUNITY)
Admission: RE | Admit: 2021-12-19 | Discharge: 2021-12-19 | Disposition: A | Discharge status: Home / Self Care | Payer: Medicare Other | Source: Ambulatory Visit | Attending: Obstetrics & Gynecology | Admitting: Obstetrics & Gynecology

## 2021-12-19 VITALS — BP 113/64 | HR 72 | Ht 66.0 in | Wt 117.6 lb

## 2021-12-19 DIAGNOSIS — Z9189 Other specified personal risk factors, not elsewhere classified: Secondary | ICD-10-CM

## 2021-12-19 DIAGNOSIS — M810 Age-related osteoporosis without current pathological fracture: Secondary | ICD-10-CM | POA: Diagnosis not present

## 2021-12-19 DIAGNOSIS — Z8619 Personal history of other infectious and parasitic diseases: Secondary | ICD-10-CM | POA: Diagnosis not present

## 2021-12-19 DIAGNOSIS — Z780 Asymptomatic menopausal state: Secondary | ICD-10-CM

## 2021-12-19 NOTE — Patient Instructions (Signed)
Need to have prevnar 20

## 2021-12-19 NOTE — Progress Notes (Signed)
66 y.o. G0P0 Married White or Caucasian female here for breast and pelvic exam.  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  Denies vaginal bleeding.  Having yearly pap smears.    Has osteoporosis.  Is on Prolia.  Needs next injection in a little over a week.  Patient's last menstrual period was 07/21/2002.          Sexually active: No.  H/O STD:  h/o chlamydia in [place] Maintenance: PCP:  Dr. Drema Dallas who has retired.  Seeing PT at [place] office.  Last appt was  Vaccines are up to date:  needs prevnar 20 and shingrix vaccination Colonoscopy:  2016 with Dr. Collene Mares.  Follow up 10 years MMG:  05/22/2021 Negative BMD:  05/22/2021 Osteoporosis of the Hip Last pap smear:  12/11/2020 Negative.   H/o abnormal pap smear:  no    reports that she has never smoked. She has never used smokeless tobacco. She reports current alcohol use. She reports that she does not use drugs.  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      COVID-19 mRNA bivalent vaccine, Pfizer, (PFIZER COVID-19 VAC BIVALENT) injection Inject into the muscle. 0.3 mL 0   fexofenadine (ALLEGRA) 180 MG tablet Take 180 mg by mouth daily.     Glucosamine  HCl-MSM (GLUCOSAMINE-MSM PO) Take by mouth daily.     meloxicam (MOBIC) 15 MG tablet Take 15 mg by mouth as needed. with food  2   Multiple Vitamin (MULTI VITAMIN DAILY PO) Take by mouth daily.     omeprazole (PRILOSEC) 20 MG capsule Take 20 mg by mouth 2 (two) times daily.  12   pravastatin (PRAVACHOL) 20 MG tablet      No current facility-administered medications for this visit.    Family History  Problem Relation Age of Onset   Osteoporosis Mother    Dementia Mother    Heart disease Maternal Grandfather     Review of Systems  All other systems reviewed and are negative.  Exam:   BP 113/64 (BP Location: Right Arm, Patient Position: Sitting, Cuff Size: Large)   Pulse 72   Ht '5\' 6"'$  (1.676 m) Comment: reported  Wt 117 lb 9.6 oz (53.3 kg)   LMP 07/21/2002   BMI 18.98 kg/m   Height: '5\' 6"'$  (167.6 cm) (reported)  General appearance: alert, cooperative and appears stated age Breasts: normal appearance, no masses or tenderness Abdomen: soft, non-tender; bowel sounds normal; no masses,  no organomegaly Lymph nodes: Cervical, supraclavicular, and axillary nodes normal.  No abnormal inguinal nodes  palpated Neurologic: Grossly normal  Pelvic: External genitalia:  no lesions              Urethra:  normal appearing urethra with no masses, tenderness or lesions              Bartholins and Skenes: normal                 Vagina: normal appearing vagina with atrophic changes and no discharge, no lesions              Cervix: absent              Pap taken: Yes.   Bimanual Exam:  Uterus:  uterus absent              Adnexa: no mass, fullness, tenderness               Rectovaginal: Confirms               Anus:  normal sphincter tone, no lesions  Chaperone, Octaviano Batty, CMA, was present for exam.  Assessment/Plan: 1. GYN exam for high-risk Medicare patient - Pap smear obtained today with obtaining sample from all four quadrants of the vagina - MMG 05/2015 - Colonoscoyp up to date with  Dr. Collene Mares 2016.  10 year follow up recommended. - BMD 04/2021 - vaccines updated - Lab work done with [place]  2. DES exposure in utero - Cytology - PAP( ) - PR OBTAINING SCREEN PAP SMEAR  3. Postmenopausal - not on HRT  4. Age-related osteoporosis without current pathological fracture - needs second prolia injection after 12/28/2021.  Will schedule appt.  5. History of chlamydia - remote

## 2021-12-20 LAB — CYTOLOGY - PAP: Diagnosis: NEGATIVE

## 2021-12-30 ENCOUNTER — Ambulatory Visit (HOSPITAL_BASED_OUTPATIENT_CLINIC_OR_DEPARTMENT_OTHER): Payer: Medicare Other

## 2022-01-06 ENCOUNTER — Ambulatory Visit (INDEPENDENT_AMBULATORY_CARE_PROVIDER_SITE_OTHER): Payer: Medicare Other

## 2022-01-06 DIAGNOSIS — M810 Age-related osteoporosis without current pathological fracture: Secondary | ICD-10-CM | POA: Diagnosis not present

## 2022-01-06 MED ORDER — DENOSUMAB 60 MG/ML ~~LOC~~ SOSY
60.0000 mg | PREFILLED_SYRINGE | Freq: Once | SUBCUTANEOUS | Status: AC
  Administered 2022-01-06: 60 mg via SUBCUTANEOUS

## 2022-01-06 NOTE — Progress Notes (Signed)
Patient came in today to receive her Prolia injection. Patient received Prolia '60mg'$  injection [place] int the back of her left arm. Patient tolerated injection well. tbw

## 2022-04-15 ENCOUNTER — Other Ambulatory Visit (HOSPITAL_BASED_OUTPATIENT_CLINIC_OR_DEPARTMENT_OTHER): Payer: Self-pay

## 2022-04-15 MED ORDER — INFLUENZA VAC A&B SA ADJ QUAD 0.5 ML IM PRSY
PREFILLED_SYRINGE | INTRAMUSCULAR | 0 refills | Status: DC
  Filled 2022-04-15: qty 0.5, 1d supply, fill #0

## 2022-04-22 ENCOUNTER — Other Ambulatory Visit (HOSPITAL_BASED_OUTPATIENT_CLINIC_OR_DEPARTMENT_OTHER): Payer: Self-pay

## 2022-07-02 ENCOUNTER — Other Ambulatory Visit (HOSPITAL_BASED_OUTPATIENT_CLINIC_OR_DEPARTMENT_OTHER): Payer: Self-pay

## 2022-07-02 MED ORDER — COMIRNATY 30 MCG/0.3ML IM SUSY
PREFILLED_SYRINGE | INTRAMUSCULAR | 0 refills | Status: DC
  Filled 2022-07-02: qty 0.3, 1d supply, fill #0

## 2022-07-17 ENCOUNTER — Other Ambulatory Visit (HOSPITAL_BASED_OUTPATIENT_CLINIC_OR_DEPARTMENT_OTHER): Payer: Medicare Other

## 2022-07-17 DIAGNOSIS — M810 Age-related osteoporosis without current pathological fracture: Secondary | ICD-10-CM

## 2022-07-18 LAB — COMPREHENSIVE METABOLIC PANEL
ALT: 21 IU/L (ref 0–32)
AST: 20 IU/L (ref 0–40)
Albumin/Globulin Ratio: 2.3 — ABNORMAL HIGH (ref 1.2–2.2)
Albumin: 4.3 g/dL (ref 3.9–4.9)
Alkaline Phosphatase: 55 IU/L (ref 44–121)
BUN/Creatinine Ratio: 24 (ref 12–28)
BUN: 17 mg/dL (ref 8–27)
Bilirubin Total: 0.4 mg/dL (ref 0.0–1.2)
CO2: 25 mmol/L (ref 20–29)
Calcium: 9.6 mg/dL (ref 8.7–10.3)
Chloride: 103 mmol/L (ref 96–106)
Creatinine, Ser: 0.72 mg/dL (ref 0.57–1.00)
Globulin, Total: 1.9 g/dL (ref 1.5–4.5)
Glucose: 81 mg/dL (ref 70–99)
Potassium: 4.1 mmol/L (ref 3.5–5.2)
Sodium: 141 mmol/L (ref 134–144)
Total Protein: 6.2 g/dL (ref 6.0–8.5)
eGFR: 92 mL/min/{1.73_m2} (ref >59)

## 2022-07-28 ENCOUNTER — Ambulatory Visit (INDEPENDENT_AMBULATORY_CARE_PROVIDER_SITE_OTHER): Payer: Medicare Other | Admitting: *Deleted

## 2022-07-28 VITALS — BP 129/63 | HR 83 | Wt 117.0 lb

## 2022-07-28 DIAGNOSIS — M810 Age-related osteoporosis without current pathological fracture: Secondary | ICD-10-CM

## 2022-07-28 MED ORDER — DENOSUMAB 60 MG/ML ~~LOC~~ SOSY
60.0000 mg | PREFILLED_SYRINGE | Freq: Once | SUBCUTANEOUS | Status: AC
  Administered 2022-07-28: 60 mg via SUBCUTANEOUS

## 2022-07-28 NOTE — Progress Notes (Signed)
Pt here for Prolia injection. Tolerated well. Provided with appt for next injection and annual exam.

## 2022-08-05 ENCOUNTER — Encounter (HOSPITAL_BASED_OUTPATIENT_CLINIC_OR_DEPARTMENT_OTHER): Payer: Self-pay | Admitting: *Deleted

## 2023-01-06 ENCOUNTER — Other Ambulatory Visit: Payer: Self-pay | Admitting: Gastroenterology

## 2023-01-06 DIAGNOSIS — R1310 Dysphagia, unspecified: Secondary | ICD-10-CM

## 2023-01-12 ENCOUNTER — Ambulatory Visit
Admission: RE | Admit: 2023-01-12 | Discharge: 2023-01-12 | Disposition: A | Discharge status: Home / Self Care | Payer: Medicare Other | Source: Ambulatory Visit | Attending: Gastroenterology | Admitting: Gastroenterology

## 2023-01-12 DIAGNOSIS — R1310 Dysphagia, unspecified: Secondary | ICD-10-CM

## 2023-01-30 ENCOUNTER — Ambulatory Visit (HOSPITAL_BASED_OUTPATIENT_CLINIC_OR_DEPARTMENT_OTHER): Payer: Medicare Other | Admitting: Obstetrics & Gynecology

## 2023-02-09 ENCOUNTER — Ambulatory Visit (HOSPITAL_BASED_OUTPATIENT_CLINIC_OR_DEPARTMENT_OTHER): Payer: Medicare Other | Admitting: Obstetrics & Gynecology

## 2023-02-09 ENCOUNTER — Encounter (HOSPITAL_BASED_OUTPATIENT_CLINIC_OR_DEPARTMENT_OTHER): Payer: Self-pay | Admitting: Obstetrics & Gynecology

## 2023-02-09 ENCOUNTER — Other Ambulatory Visit (HOSPITAL_COMMUNITY)
Admission: RE | Admit: 2023-02-09 | Discharge: 2023-02-09 | Disposition: A | Discharge status: Home / Self Care | Payer: Medicare Other | Source: Ambulatory Visit | Attending: Obstetrics & Gynecology | Admitting: Obstetrics & Gynecology

## 2023-02-09 VITALS — BP 114/63 | HR 74 | Ht 66.75 in | Wt 123.8 lb

## 2023-02-09 DIAGNOSIS — Z01419 Encounter for gynecological examination (general) (routine) without abnormal findings: Secondary | ICD-10-CM

## 2023-02-09 DIAGNOSIS — Z9189 Other specified personal risk factors, not elsewhere classified: Secondary | ICD-10-CM | POA: Diagnosis not present

## 2023-02-09 DIAGNOSIS — Z780 Asymptomatic menopausal state: Secondary | ICD-10-CM

## 2023-02-09 DIAGNOSIS — M810 Age-related osteoporosis without current pathological fracture: Secondary | ICD-10-CM | POA: Diagnosis not present

## 2023-02-09 MED ORDER — DENOSUMAB 60 MG/ML ~~LOC~~ SOSY
60.0000 mg | PREFILLED_SYRINGE | Freq: Once | SUBCUTANEOUS | Status: AC
  Administered 2023-02-09: 60 mg via SUBCUTANEOUS

## 2023-02-09 NOTE — Addendum Note (Signed)
Addended by: Harrie Jeans on: 02/09/2023 04:27 PM   Modules accepted: Orders

## 2023-02-09 NOTE — Progress Notes (Signed)
67 y.o. G0P0 Married White or Caucasian female here for breast and pelvic exam.  I am also following her for history of osteoporosis.  Prolia injection is due today.  BMD is going to be ordered for November.  She will do with her mammogram.  Denies vaginal bleeding.  Patient's last menstrual period was 07/21/2002.          Sexually active: No.  H/O STD:  no  Health Maintenance: PCP:  is leaving [facility].  Last wellness appt was 07/2022.  Did blood work at that appt:  yes.  She is deciding about who she will see next year. Vaccines are up to date:  needs prevnar 20 and shingrix vaccination  Colonoscopy:  2016 with Dr. Loreta Ave.  Follow up 10 years. MMG:  06/04/2022 BMD:  05/2021 Last pap smear:  12/2021   H/o abnormal pap smear:  no    reports that she has never smoked. She has never used smokeless tobacco. She reports current alcohol use. She reports that she does not use drugs.  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      COVID-19 mRNA bivalent vaccine, Pfizer, (PFIZER COVID-19 VAC BIVALENT) injection Inject into the muscle. 0.3 mL 0   COVID-19 mRNA vaccine 2023-2024 (COMIRNATY) syringe Inject into the  muscle. 0.3 mL 0   fexofenadine (ALLEGRA) 180 MG tablet Take 180 mg by mouth daily.     Glucosamine HCl-MSM (GLUCOSAMINE-MSM PO) Take by mouth daily.     influenza vaccine adjuvanted (FLUAD) 0.5 ML injection Inject into the muscle. 0.5 mL 0   meloxicam (MOBIC) 15 MG tablet Take 15 mg by mouth as needed. with food  2   Multiple Vitamin (MULTI VITAMIN DAILY PO) Take by mouth daily.     omeprazole (PRILOSEC) 20 MG capsule Take 20 mg by mouth 2 (two) times daily.  12   pravastatin (PRAVACHOL) 20 MG tablet      No current facility-administered medications for this visit.    Family History  Problem Relation Age of Onset   Osteoporosis Mother    Dementia Mother    Heart disease Maternal Grandfather     Review of Systems  Constitutional: Negative.   Genitourinary: Negative.     Exam:   BP 114/63 (BP Location: Right Arm, Patient Position: Sitting, Cuff Size: Normal)   Pulse 74   Ht 5' 6.75" (1.695 m) Comment: Reported  Wt 123 lb 12.8 oz (56.2 kg)   LMP 07/21/2002   BMI 19.54 kg/m   Height: 5' 6.75" (169.5 cm) (Reported)  General appearance: alert, cooperative and appears stated age Breasts: normal appearance, no masses or tenderness Abdomen: soft, non-tender; bowel sounds normal; no masses,  no organomegaly Lymph nodes: Cervical, supraclavicular, and axillary nodes normal.  No abnormal inguinal nodes palpated Neurologic: Grossly normal  Pelvic: External genitalia:  no lesions              Urethra:  normal appearing urethra with no masses, tenderness or lesions              Bartholins and Skenes: normal                 Vagina: normal appearing vagina with atrophic changes and no discharge, no lesions              Cervix: absent              Pap taken:yes Bimanual Exam:  Uterus:  surgically absent              Adnexa: no masses               Rectovaginal: Confirms               Anus:  normal sphincter tone, no lesions  Chaperone, Ina Homes, CMA, was present for  exam.  Assessment/Plan: 1. Encntr for gyn exam (general) (routine) w/o abn findings - Pap smear obtained - Mammogram 05/2022 - Colonoscopy 2016.  Follow up 10 years. - Bone mineral density due 05/2023 - lab work done with PCP earlier this year - vaccines reviewed/updated  2. Age-related osteoporosis without current pathological fracture - DG BONE DENSITY (DXA); Future - prolia injection will be given today.  Last calcium was 07/17/2022.  Will check BMD 05/2023 before deciding about additional treatment  3. DES exposure in utero - Cytology - PAP( [place])  4. Postmenopausal

## 2023-02-12 LAB — CYTOLOGY - PAP: Diagnosis: NEGATIVE

## 2023-05-14 ENCOUNTER — Other Ambulatory Visit (HOSPITAL_BASED_OUTPATIENT_CLINIC_OR_DEPARTMENT_OTHER): Payer: Self-pay

## 2023-05-14 MED ORDER — FLUAD 0.5 ML IM SUSY
0.5000 mL | PREFILLED_SYRINGE | Freq: Once | INTRAMUSCULAR | 0 refills | Status: AC
  Filled 2023-05-14: qty 0.5, 1d supply, fill #0

## 2023-06-09 ENCOUNTER — Encounter (HOSPITAL_BASED_OUTPATIENT_CLINIC_OR_DEPARTMENT_OTHER): Payer: Self-pay | Admitting: Obstetrics & Gynecology

## 2023-06-10 ENCOUNTER — Encounter (HOSPITAL_BASED_OUTPATIENT_CLINIC_OR_DEPARTMENT_OTHER): Payer: Self-pay | Admitting: Obstetrics & Gynecology

## 2023-06-12 ENCOUNTER — Other Ambulatory Visit (HOSPITAL_BASED_OUTPATIENT_CLINIC_OR_DEPARTMENT_OTHER): Payer: Self-pay | Admitting: Obstetrics & Gynecology

## 2023-06-12 NOTE — Telephone Encounter (Signed)
Called Cheryl Reynolds in response to [employer]. DOB verified. Informed Cheryl Reynolds that her bone density was actually better in her hips and spine.  There is still ostoeporosis but these are better.  Informed that her arm measurements were in the normal range. Advised that Dr. Hyacinth Meeker does want her to stay on therapy and repeat the bone density in two years. Cheryl Reynolds provided with appt for CMP and Prolia injection. Advised that Dr. Hyacinth Meeker is not familiar with the treatment for her legs that she is asking about, but says if she is going to an reputable practice, then they have probably made a good recommendation for her treatment. Cheryl Reynolds verbalized understanding.

## 2023-06-29 ENCOUNTER — Other Ambulatory Visit (HOSPITAL_BASED_OUTPATIENT_CLINIC_OR_DEPARTMENT_OTHER): Payer: Self-pay

## 2023-06-29 ENCOUNTER — Other Ambulatory Visit: Payer: Self-pay | Admitting: Obstetrics & Gynecology

## 2023-06-29 ENCOUNTER — Other Ambulatory Visit (HOSPITAL_BASED_OUTPATIENT_CLINIC_OR_DEPARTMENT_OTHER): Payer: Medicare Other

## 2023-06-29 DIAGNOSIS — Z9229 Personal history of other drug therapy: Secondary | ICD-10-CM

## 2023-06-29 MED ORDER — COVID-19 MRNA VAC-TRIS(PFIZER) 30 MCG/0.3ML IM SUSY
0.3000 mL | PREFILLED_SYRINGE | Freq: Once | INTRAMUSCULAR | 0 refills | Status: AC
  Filled 2023-06-29: qty 0.3, 1d supply, fill #0

## 2023-06-29 NOTE — Progress Notes (Signed)
Patient came in to have blood work drawn for calcium levels. Patient is currently on Prolia. tbw

## 2023-06-30 LAB — COMPREHENSIVE METABOLIC PANEL
ALT: 27 [IU]/L (ref 0–32)
AST: 25 [IU]/L (ref 0–40)
Albumin: 3.9 g/dL (ref 3.9–4.9)
Alkaline Phosphatase: 53 [IU]/L (ref 44–121)
BUN/Creatinine Ratio: 20 (ref 12–28)
BUN: 17 mg/dL (ref 8–27)
Bilirubin Total: 0.5 mg/dL (ref 0.0–1.2)
CO2: 26 mmol/L (ref 20–29)
Calcium: 9.2 mg/dL (ref 8.7–10.3)
Chloride: 104 mmol/L (ref 96–106)
Creatinine, Ser: 0.85 mg/dL (ref 0.57–1.00)
Globulin, Total: 1.9 g/dL (ref 1.5–4.5)
Glucose: 75 mg/dL (ref 70–99)
Potassium: 4.6 mmol/L (ref 3.5–5.2)
Sodium: 143 mmol/L (ref 134–144)
Total Protein: 5.8 g/dL — ABNORMAL LOW (ref 6.0–8.5)
eGFR: 75 mL/min/{1.73_m2} (ref >59)

## 2023-08-17 ENCOUNTER — Ambulatory Visit (HOSPITAL_BASED_OUTPATIENT_CLINIC_OR_DEPARTMENT_OTHER): Payer: Medicare Other | Admitting: *Deleted

## 2023-08-17 DIAGNOSIS — M810 Age-related osteoporosis without current pathological fracture: Secondary | ICD-10-CM | POA: Diagnosis not present

## 2023-09-18 MED ORDER — DENOSUMAB 60 MG/ML ~~LOC~~ SOSY
60.0000 mg | PREFILLED_SYRINGE | Freq: Once | SUBCUTANEOUS | Status: AC
  Administered 2023-08-17: 60 mg via SUBCUTANEOUS

## 2023-09-18 MED ORDER — DENOSUMAB 60 MG/ML ~~LOC~~ SOSY: 60.0000 mg | PREFILLED_SYRINGE | Freq: Once | SUBCUTANEOUS | Status: DC

## 2023-09-18 NOTE — Progress Notes (Signed)
 Pt was given Prolia injection by Ina Homes, CMA. Pt tolerated well.

## 2024-02-03 ENCOUNTER — Other Ambulatory Visit (HOSPITAL_BASED_OUTPATIENT_CLINIC_OR_DEPARTMENT_OTHER): Payer: Self-pay | Admitting: *Deleted

## 2024-02-03 MED ORDER — DENOSUMAB 60 MG/ML ~~LOC~~ SOSY
60.0000 mg | PREFILLED_SYRINGE | SUBCUTANEOUS | Status: AC
  Administered 2024-02-15: 60 mg via SUBCUTANEOUS

## 2024-02-03 NOTE — Progress Notes (Signed)
 Ordered entry for next Prolia  due after 02/14/24. Pt has apt 7/28 no PA needed ok for injection that day. KD

## 2024-02-12 ENCOUNTER — Ambulatory Visit (HOSPITAL_BASED_OUTPATIENT_CLINIC_OR_DEPARTMENT_OTHER): Payer: Medicare Other | Admitting: Obstetrics & Gynecology

## 2024-02-15 ENCOUNTER — Ambulatory Visit (INDEPENDENT_AMBULATORY_CARE_PROVIDER_SITE_OTHER): Payer: Medicare Other | Admitting: Obstetrics & Gynecology

## 2024-02-15 ENCOUNTER — Other Ambulatory Visit (HOSPITAL_COMMUNITY)
Admission: RE | Admit: 2024-02-15 | Discharge: 2024-02-15 | Disposition: A | Discharge status: Home / Self Care | Source: Ambulatory Visit | Attending: Obstetrics & Gynecology | Admitting: Obstetrics & Gynecology

## 2024-02-15 ENCOUNTER — Encounter (HOSPITAL_BASED_OUTPATIENT_CLINIC_OR_DEPARTMENT_OTHER): Payer: Self-pay | Admitting: Obstetrics & Gynecology

## 2024-02-15 VITALS — BP 125/66 | HR 77 | Wt 123.0 lb

## 2024-02-15 DIAGNOSIS — M810 Age-related osteoporosis without current pathological fracture: Secondary | ICD-10-CM

## 2024-02-15 DIAGNOSIS — Z9189 Other specified personal risk factors, not elsewhere classified: Secondary | ICD-10-CM | POA: Insufficient documentation

## 2024-02-15 DIAGNOSIS — Z780 Asymptomatic menopausal state: Secondary | ICD-10-CM

## 2024-02-15 DIAGNOSIS — Z01419 Encounter for gynecological examination (general) (routine) without abnormal findings: Secondary | ICD-10-CM | POA: Diagnosis not present

## 2024-02-15 DIAGNOSIS — E559 Vitamin D deficiency, unspecified: Secondary | ICD-10-CM | POA: Diagnosis not present

## 2024-02-15 DIAGNOSIS — Z79899 Other long term (current) drug therapy: Secondary | ICD-10-CM | POA: Diagnosis not present

## 2024-02-15 DIAGNOSIS — Z23 Encounter for immunization: Secondary | ICD-10-CM

## 2024-02-15 MED ORDER — DENOSUMAB 60 MG/ML ~~LOC~~ SOSY
60.0000 mg | PREFILLED_SYRINGE | SUBCUTANEOUS | Status: AC
  Administered 2024-08-17: 60 mg via SUBCUTANEOUS

## 2024-02-15 NOTE — Addendum Note (Signed)
 Addended by: CLEOTILDE RONAL RAMAN on: 02/15/2024 06:17 PM   Modules accepted: Orders

## 2024-02-15 NOTE — Progress Notes (Addendum)
 Breast and Pelvic exam Patient name: Cheryl Reynolds MRN [id number]  Date of birth: 06-Jan-1956 Chief Complaint:   Gynecologic Exam (Would like to know thoughts on [facility] checking heart via scan)  History of Present Illness:   Cheryl Reynolds is a 68 y.o. G0P0 Caucasian female here for breast and pelvic exam.  Denies vaginal bleeding.  Receiving prolia .  Repeat BMD due 05/2025.  Will need calcium level done prior to next injection.   Patient's last menstrual period was 07/21/2002.  Last pap 2024 Last mammogram: 06/01/2023. Results were: normal. Family h/o breast cancer: no Last colonoscopy: 2016.  Follow up 10 years.   Dexa:  05/2023     02/15/2024    2:14 PM 02/09/2023    3:45 PM 12/19/2021   10:29 AM  Depression screen PHQ 2/9  Decreased Interest 0 0 0  Down, Depressed, Hopeless 0 0 0  PHQ - 2 Score 0 0 0      Review of Systems:   Pertinent items are noted in HPI Denies any bowel or bladder changes.  Denies pelvic pain.   Pertinent History Reviewed:  Reviewed past medical,surgical, social and family history.  Reviewed problem list, medications and allergies. Physical Assessment:   Vitals:   02/15/24 1336  BP: 125/66  Pulse: 77  SpO2: 98%  Weight: 123 lb (55.8 kg)  Body mass index is 19.41 kg/m.        Physical Examination:   General appearance - well appearing, and in no distress  Mental status - alert, oriented to person, place, and time  Psych:  She has a normal mood and affect  Skin - warm and dry, normal color, no suspicious lesions noted  Chest - effort normal, all lung fields clear to auscultation bilaterally  Heart - normal rate and regular rhythm  Neck:  midline trachea, no thyromegaly or nodules  Breasts - breasts appear normal, no suspicious masses, no skin or nipple changes or  axillary nodes  Abdomen - soft, nontender, nondistended, no masses or organomegaly  Pelvic - VULVA: normal appearing vulva with no masses, tenderness or lesions   VAGINA: normal  appearing vagina with normal color and discharge, no lesions   CERVIX: surgically absent  Thin prep pap is obtained today  UTERUS: surgically absent  ADNEXA: No adnexal masses or tenderness noted.  Rectal - normal rectal, good sphincter tone, no masses felt.   Extremities:  No swelling or varicosities noted  Chaperone present for exam  No results found for this or any previous visit (from the past 24 hours).  Assessment & Plan:  1. Encntr for gyn exam (general) (routine) w/o abn findings (Primary) - Pap smear updated today - Mammogram 05/2023 - Colonoscopy 2016.  Follow up 10 years. - Bone mineral density 05/2023. - lab work done with PCP - vaccines reviewed/updated   2. Postmenopausal  3. Age-related osteoporosis without current pathological fracture -  on Prolia .  Repeat 6 months.  Will need blood work prior to next injection. -  order for Prolia  placed for 6 months from now.  4. Vitamin D  deficiency - taking calcium with Vit D three times daily.    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 - Cytology - PAP( [place]) - PR OBTAINING SCREEN PAP SMEAR   Meds:  Meds ordered this encounter  Medications   denosumab  (PROLIA ) injection 60 mg    Patient is enrolled in [employer] program for this medication and I have provided a copy of the Prolia  Medication Guide  and Patient Brochure.:   No    I have reviewed with the patient the information in the Prolia  Medication Guide and Patient Counseling Chart including the serious risks of Prolia  and symptoms of each risk.:   Yes    I have advised the patient to seek medical attention if they have signs or symptoms of any of the serious risks.:   Yes    Follow-up: Return in about 1 year (around 02/14/2025).  Ronal GORMAN Pinal, MD 02/15/2024 6:17 PM

## 2024-02-21 LAB — CYTOLOGY - PAP: Diagnosis: NEGATIVE

## 2024-02-22 ENCOUNTER — Ambulatory Visit (HOSPITAL_BASED_OUTPATIENT_CLINIC_OR_DEPARTMENT_OTHER): Payer: Self-pay | Admitting: Certified Nurse Midwife

## 2024-05-05 ENCOUNTER — Other Ambulatory Visit (HOSPITAL_BASED_OUTPATIENT_CLINIC_OR_DEPARTMENT_OTHER): Payer: Self-pay

## 2024-05-05 MED ORDER — FLUZONE HIGH-DOSE 0.5 ML IM SUSY
0.5000 mL | PREFILLED_SYRINGE | Freq: Once | INTRAMUSCULAR | 0 refills | Status: AC
  Filled 2024-05-05: qty 0.5, 1d supply, fill #0

## 2024-06-28 ENCOUNTER — Telehealth (HOSPITAL_BASED_OUTPATIENT_CLINIC_OR_DEPARTMENT_OTHER): Payer: Self-pay

## 2024-06-28 DIAGNOSIS — Z79899 Other long term (current) drug therapy: Secondary | ICD-10-CM

## 2024-06-28 NOTE — Telephone Encounter (Signed)
 Patient called and wanted to know if she has to get blood work done prior to her getting her prolia  injection in January. Would like for someone to call her and let her know.

## 2024-06-28 NOTE — Telephone Encounter (Signed)
 Spoke with patient. Advised patient that Dr.Miller would like her to have a CMP prior to her next Prolia  injection. Patient is agreeable. Lab appointment scheduled for 07/06/2024 at 1:30 pm. Future orders have been placed.

## 2024-07-06 ENCOUNTER — Other Ambulatory Visit (HOSPITAL_BASED_OUTPATIENT_CLINIC_OR_DEPARTMENT_OTHER)

## 2024-07-06 ENCOUNTER — Other Ambulatory Visit (HOSPITAL_BASED_OUTPATIENT_CLINIC_OR_DEPARTMENT_OTHER): Payer: Self-pay

## 2024-07-06 MED ORDER — COMIRNATY 30 MCG/0.3ML IM SUSY
0.3000 mL | PREFILLED_SYRINGE | Freq: Once | INTRAMUSCULAR | 0 refills | Status: AC
  Filled 2024-07-06: qty 0.3, 1d supply, fill #0

## 2024-07-07 LAB — COMPREHENSIVE METABOLIC PANEL WITH GFR
ALT: 19 IU/L (ref 0–32)
AST: 25 IU/L (ref 0–40)
Albumin: 4.3 g/dL (ref 3.9–4.9)
Alkaline Phosphatase: 56 IU/L (ref 49–135)
BUN/Creatinine Ratio: 23 (ref 12–28)
BUN: 18 mg/dL (ref 8–27)
Bilirubin Total: 0.3 mg/dL (ref 0.0–1.2)
CO2: 26 mmol/L (ref 20–29)
Calcium: 9.7 mg/dL (ref 8.7–10.3)
Chloride: 102 mmol/L (ref 96–106)
Creatinine, Ser: 0.79 mg/dL (ref 0.57–1.00)
Globulin, Total: 2.1 g/dL (ref 1.5–4.5)
Glucose: 111 mg/dL — ABNORMAL HIGH (ref 70–99)
Potassium: 4.4 mmol/L (ref 3.5–5.2)
Sodium: 141 mmol/L (ref 134–144)
Total Protein: 6.4 g/dL (ref 6.0–8.5)
eGFR: 81 mL/min/1.73 (ref >59)

## 2024-07-28 ENCOUNTER — Telehealth (HOSPITAL_BASED_OUTPATIENT_CLINIC_OR_DEPARTMENT_OTHER): Payer: Self-pay

## 2024-07-28 ENCOUNTER — Other Ambulatory Visit (HOSPITAL_COMMUNITY): Payer: Self-pay

## 2024-07-28 NOTE — Telephone Encounter (Signed)
 SABRA

## 2024-07-28 NOTE — Telephone Encounter (Signed)
 Left message to call Nayab Aten at [phone].

## 2024-07-28 NOTE — Telephone Encounter (Signed)
 Prolia  VOB initiated via [email or web address]  Next Prolia  inj DUE: 08/17/24

## 2024-07-28 NOTE — Telephone Encounter (Signed)
 Buy/Bill (Office supplied medication)  Out-of-pocket cost due at time of clinic visit: $283  Number of injection/visits approved: ---  Primary: MEDICARE Co-insurance: 0% Admin fee co-insurance: 0%  Secondary: [employer] Co-insurance: covers the Medicare Part B co-insurance and 100% of the excess charges. This plan does not cover the Medicare Part B deductible Admin fee co-insurance:   Medical Benefit Details: Date Benefits were checked: 07/28/24 Deductible: $0 Met of $283 Required/ Coinsurance: 0%/ Admin Fee: 0%  Prior Auth: N/A PA# Expiration Date:   # of doses approved: -----------------------------------------------------------------------  Patient NOT eligible for Copay Card. Copay Card can make patient's cost as little as $25. Link to apply: [email or web address]://[email or web address]  ** This summary of benefits is an estimation of the patient's out-of-pocket cost. Exact cost may very based on individual plan coverage.

## 2024-08-01 NOTE — Telephone Encounter (Signed)
 Patient read mychart message regarding benefits on 07/29/2024 at 11:14 am.

## 2024-08-04 ENCOUNTER — Ambulatory Visit (INDEPENDENT_AMBULATORY_CARE_PROVIDER_SITE_OTHER)

## 2024-08-04 ENCOUNTER — Encounter (HOSPITAL_BASED_OUTPATIENT_CLINIC_OR_DEPARTMENT_OTHER): Payer: Self-pay

## 2024-08-04 VITALS — BP 128/62 | HR 90

## 2024-08-04 DIAGNOSIS — R3915 Urgency of urination: Secondary | ICD-10-CM

## 2024-08-04 DIAGNOSIS — R300 Dysuria: Secondary | ICD-10-CM

## 2024-08-04 LAB — POCT URINALYSIS DIP (CLINITEK)
Bilirubin, UA: NEGATIVE
Glucose, UA: NEGATIVE mg/dL
Ketones, POC UA: NEGATIVE mg/dL
Nitrite, UA: NEGATIVE
POC PROTEIN,UA: NEGATIVE
Spec Grav, UA: 1.025
Urobilinogen, UA: 0.2 U/dL
pH, UA: 7

## 2024-08-04 MED ORDER — NITROFURANTOIN MONOHYD MACRO 100 MG PO CAPS: 100.0000 mg | ORAL_CAPSULE | Freq: Two times a day (BID) | ORAL | 0 refills | Status: AC

## 2024-08-04 NOTE — Progress Notes (Signed)
 NURSE VISIT- UTI SYMPTOMS   SUBJECTIVE:  Cheryl Reynolds is a 69 y.o. G0P0 female here for UTI symptoms. She is a GYN patient. She reports urinary urgency and irritation after urinating.   OBJECTIVE:  LMP 07/21/2002   Appears well, in no apparent distress  ASSESSMENT: GYN patient with UTI symptoms and negative nitrites  PLAN: Visit routed to or discussed with:  Rx sent today: Yes Urine culture sent Call or return to clinic prn if these symptoms worsen or fail to improve as anticipated. Follow-up: as needed   Morna LOISE Quale, RN

## 2024-08-06 LAB — URINE CULTURE

## 2024-08-08 ENCOUNTER — Ambulatory Visit: Payer: Self-pay | Admitting: Obstetrics and Gynecology

## 2024-08-11 ENCOUNTER — Telehealth (HOSPITAL_BASED_OUTPATIENT_CLINIC_OR_DEPARTMENT_OTHER): Payer: Self-pay

## 2024-08-11 NOTE — Telephone Encounter (Signed)
 Patient left message on nurses' line stating that she has completed antibiotic for UTI and wondering if she needs to come in for repeat test.   Spoke with patient and advised that we can repeat urine on Wednesday when she comes in for her Prolia  injection. Patient verbalized understanding with no further questions or concerns at this time. Advised patient that urine culture was negative as well.   Morna LOISE Quale, RN

## 2024-08-17 ENCOUNTER — Encounter (HOSPITAL_BASED_OUTPATIENT_CLINIC_OR_DEPARTMENT_OTHER): Payer: Self-pay

## 2024-08-17 ENCOUNTER — Ambulatory Visit (INDEPENDENT_AMBULATORY_CARE_PROVIDER_SITE_OTHER)

## 2024-08-17 VITALS — BP 135/65 | HR 77 | Wt 127.2 lb

## 2024-08-17 DIAGNOSIS — R300 Dysuria: Secondary | ICD-10-CM

## 2024-08-17 DIAGNOSIS — M810 Age-related osteoporosis without current pathological fracture: Secondary | ICD-10-CM

## 2024-08-17 DIAGNOSIS — R3915 Urgency of urination: Secondary | ICD-10-CM

## 2024-08-17 LAB — POCT URINALYSIS DIPSTICK
Bilirubin, UA: NEGATIVE
Glucose, UA: NEGATIVE
Ketones, UA: NEGATIVE
Leukocytes, UA: NEGATIVE
Nitrite, UA: NEGATIVE
Protein, UA: NEGATIVE
Spec Grav, UA: 1.02
Urobilinogen, UA: 0.2 U/dL
pH, UA: 6.5

## 2024-08-17 MED ORDER — DENOSUMAB 60 MG/ML ~~LOC~~ SOSY: 60.0000 mg | PREFILLED_SYRINGE | SUBCUTANEOUS | Status: AC

## 2024-08-17 NOTE — Progress Notes (Signed)
 Patient in today for third Prolia  injection.  Last calcium level date: 07/06/2024. Result: 9.7.  Last AEX: 02/15/2024 Last BMD: 06/01/2023  Injection given in left arm (subcutaneously).  Patient tolerated injection well.  Routed to provider for review.   Patient also wanted urine rechecked. She states that she came in for urinalysis that came back abnormal. Patient has finished medication and feels a lot better.

## 2025-02-17 ENCOUNTER — Ambulatory Visit (HOSPITAL_BASED_OUTPATIENT_CLINIC_OR_DEPARTMENT_OTHER): Admitting: Obstetrics & Gynecology
